# Patient Record
Sex: Male | Born: 1985 | Race: White | Hispanic: No | Marital: Married | Smoking: Never smoker
Health system: Southern US, Community
[De-identification: ages and names within clinical notes are randomized; demographics above are authoritative.]

## PROBLEM LIST (undated history)

## (undated) DIAGNOSIS — F909 Attention-deficit hyperactivity disorder, unspecified type: Secondary | ICD-10-CM

## (undated) DIAGNOSIS — F419 Anxiety disorder, unspecified: Secondary | ICD-10-CM

## (undated) HISTORY — PX: CHEST WALL RECONSTRUCTION: SHX1339

---

## 2008-08-13 ENCOUNTER — Encounter: Admission: RE | Admit: 2008-08-13 | Discharge: 2008-08-13 | Payer: Self-pay | Admitting: Ophthalmology

## 2012-07-27 ENCOUNTER — Ambulatory Visit (INDEPENDENT_AMBULATORY_CARE_PROVIDER_SITE_OTHER): Payer: 59 | Admitting: Family Medicine

## 2012-07-27 VITALS — BP 137/79 | HR 87 | Temp 98.3°F | Resp 16 | Ht 72.0 in | Wt 209.2 lb

## 2012-07-27 DIAGNOSIS — F411 Generalized anxiety disorder: Secondary | ICD-10-CM

## 2012-07-27 DIAGNOSIS — F988 Other specified behavioral and emotional disorders with onset usually occurring in childhood and adolescence: Secondary | ICD-10-CM

## 2012-07-27 NOTE — Patient Instructions (Addendum)
Psychologists and try to get in for ADD/ADHD testing. Tell them that you have spoken to me and I recommended you see them and get me a report as to what they're testing reveals. [facility]: Wyn[facility] Fanny, [phone], [phone] [facility] [phone], [phone]  I am not prescribing any medications at this time until I get results of testing.

## 2012-07-27 NOTE — Progress Notes (Signed)
Subjective: 27 year old male who feels like he has had problems with attention deficit problems for many years, dating back to school years. He completed high school, did about one year of college, and is taken some college level courses. 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 He has been married for 2 years, and has their first child on the way in the next couple of months. He is always active and gets a lot of exercise with what he does, though it is not do a formal regular physical exercise program. He has noticed many lapses where he just doesn't feel like he is focusing paying attention in doing so his tests in the proper fashion. He is not having trouble with this. He has not had problems in the emergent-type situations, only when he is going to his drills and retains. He has a hard time focusing when he reads and has to repeat reading the patient several times. Does not smoke or use drugs, occasional social drink. Physically he has been healthy. He had a surgery for pectus excavatum when he was a child.  Objective: Long discussion with patient. Physical exam not done.  Assessment: Possible attention deficit disorder.  Plan: Refer to psychologist for ADD testing. After something to replace yesterday, he had gone to human resources to see a psychologist who wanted him to see the doctor at that place, but he couldn't get a appointment for a long stretch. Therefore he came on in here today. He is wanting to get rolling on treatment as soon as possible, especially because of upcoming child in the family etc. His wife does have attention deficit disorder history, though she is off medications for now while she is pregnant.

## 2012-08-02 ENCOUNTER — Ambulatory Visit (INDEPENDENT_AMBULATORY_CARE_PROVIDER_SITE_OTHER): Payer: 59 | Admitting: Family Medicine

## 2012-08-02 ENCOUNTER — Encounter: Payer: Self-pay | Admitting: Family Medicine

## 2012-08-02 VITALS — BP 120/82 | HR 68 | Temp 97.8°F | Ht 72.5 in | Wt 208.0 lb

## 2012-08-02 DIAGNOSIS — F988 Other specified behavioral and emotional disorders with onset usually occurring in childhood and adolescence: Secondary | ICD-10-CM | POA: Insufficient documentation

## 2012-08-02 MED ORDER — AMPHETAMINE-DEXTROAMPHET ER 20 MG PO CP24: 20.0000 mg | ORAL_CAPSULE | ORAL | Status: DC

## 2012-08-02 NOTE — Patient Instructions (Addendum)
It was so nice to meet you.  Please call me in the next couple of weeks with an update.

## 2012-08-02 NOTE — Progress Notes (Signed)
Subjective:  Very pleasant 27 year old male here to establish care.  I see his wife, Morrie Sheldon.  He feels he has had problems with attention deficit problems for many years, dating back to school years. He completed high school, did about one year of college, and is taken some college level courses. 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 He has been married for 2 years, and has their first child on the way in the next couple of months.   He feels his attention has worsened recently.   He has noticed many lapses where he just doesn't feel like he is focusing paying attention in doing so his tests in the proper fashion.   He has never had issues focusing in emergency setting.  Went to UC earlier this month for this issue.  Was referred for psych eval.  Had appt with Hurley Cisco at [facility] on 08/01/2012.  Notes faxed here and reviewed.  She did feel he has ADD.  He is anxious to get started on medications now that his wife is expecting.  Patient Active Problem List   Diagnosis Date Noted  . ADD (attention deficit disorder) 08/02/2012   No past medical history on file. Past Surgical History  Procedure Laterality Date  . Chest wall reconstruction     History  Substance Use Topics  . Smoking status: Never Smoker   . Smokeless tobacco: Not on file  . Alcohol Use: Not on file   No family history on file. Allergies  Allergen Reactions  . Benadryl (Diphenhydramine Hcl) Other (See Comments)    Hyperactivity   No current outpatient prescriptions on file prior to visit.   No current facility-administered medications on file prior to visit.   The PMH, PSH, Social History, Family History, Medications, and allergies have been reviewed in [facility], and have been updated if relevant.  Objective: BP 120/82  Pulse 68  Temp(Src) 97.8 F (36.6 C)  Ht 6' 0.5" (1.842 m)  Wt 208 lb (94.348 kg)  BMI 27.81 kg/m2  Gen: Alert, pleasant, NAD Psych: Good  eye contact, not anxious or depressed appearing  Assessment and Plan: 1. ADD (attention deficit disorder) >25 min spent with face to face with patient, >50% counseling and/or coordinating care Start Adderall 25 mg XL daily. Follow up with me in 2-3 weeks.  Has appt with psych next week.  Encouraged him to continue those appt as well. The patient indicates understanding of these issues and agrees with the plan.     A

## 2012-08-04 ENCOUNTER — Telehealth: Payer: Self-pay | Admitting: *Deleted

## 2012-08-04 NOTE — Telephone Encounter (Signed)
Forms faxed to employer, patient advised.  Copy sent for scanning.

## 2012-08-04 NOTE — Telephone Encounter (Signed)
Forms completed and on my desk. 

## 2012-08-04 NOTE — Telephone Encounter (Signed)
Patient called to report that this is his 3rd day of taking adderall and today he's feeling a little jittery and has a bit of a headache, which he says you told him could happen.  He says he would like to give his body more time to adjust to the medicine, so, he's asking that the [facility] paperwork be completed and that he be able to stay out of work until 6/26.  Forms are on your desk.

## 2012-08-29 ENCOUNTER — Ambulatory Visit: Payer: Self-pay | Admitting: Family Medicine

## 2012-08-29 ENCOUNTER — Other Ambulatory Visit: Payer: Self-pay

## 2012-08-29 ENCOUNTER — Encounter: Payer: Self-pay | Admitting: Family Medicine

## 2012-08-29 MED ORDER — AMPHETAMINE-DEXTROAMPHET ER 20 MG PO CP24: 20.0000 mg | ORAL_CAPSULE | ORAL | Status: DC

## 2012-08-29 NOTE — Telephone Encounter (Signed)
Craig Fletcher left v/m requesting rx for generic adderall XR.(pharmacy has been giving pt brand name.) Call when ready for pick up.

## 2012-08-29 NOTE — Telephone Encounter (Signed)
Patient advised.  Rx left at front desk for pick up. 

## 2012-09-07 ENCOUNTER — Ambulatory Visit (INDEPENDENT_AMBULATORY_CARE_PROVIDER_SITE_OTHER): Payer: 59 | Admitting: Family Medicine

## 2012-09-07 ENCOUNTER — Encounter: Payer: Self-pay | Admitting: Family Medicine

## 2012-09-07 VITALS — BP 120/84 | HR 68 | Temp 97.6°F | Ht 72.5 in | Wt 201.0 lb

## 2012-09-07 DIAGNOSIS — R5383 Other fatigue: Secondary | ICD-10-CM | POA: Insufficient documentation

## 2012-09-07 DIAGNOSIS — R5381 Other malaise: Secondary | ICD-10-CM | POA: Insufficient documentation

## 2012-09-07 DIAGNOSIS — F988 Other specified behavioral and emotional disorders with onset usually occurring in childhood and adolescence: Secondary | ICD-10-CM

## 2012-09-07 LAB — CBC WITH DIFFERENTIAL/PLATELET
Basophils Absolute: 0 10*3/uL (ref 0.0–0.1)
Basophils Relative: 0.5 % (ref 0.0–3.0)
Eosinophils Absolute: 0 10*3/uL (ref 0.0–0.7)
Eosinophils Relative: 0.6 % (ref 0.0–5.0)
HCT: 42.3 % (ref 39.0–52.0)
Hemoglobin: 14.4 g/dL (ref 13.0–17.0)
Lymphocytes Relative: 17.9 % (ref 12.0–46.0)
Lymphs Abs: 1 10*3/uL (ref 0.7–4.0)
MCHC: 34 g/dL (ref 30.0–36.0)
MCV: 87.3 fl (ref 78.0–100.0)
Monocytes Absolute: 0.5 10*3/uL (ref 0.1–1.0)
Monocytes Relative: 8.7 % (ref 3.0–12.0)
Neutro Abs: 4.2 10*3/uL (ref 1.4–7.7)
Neutrophils Relative %: 72.3 % (ref 43.0–77.0)
Platelets: 246 10*3/uL (ref 150.0–400.0)
RBC: 4.84 Mil/uL (ref 4.22–5.81)
RDW: 13.7 % (ref 11.5–14.6)
WBC: 5.8 10*3/uL (ref 4.5–10.5)

## 2012-09-07 LAB — TSH: TSH: 2.21 u[IU]/mL (ref 0.35–5.50)

## 2012-09-07 LAB — T4, FREE: Free T4: 0.83 ng/dL (ref 0.60–1.60)

## 2012-09-07 LAB — TESTOSTERONE: Testosterone: 278.51 ng/dL — ABNORMAL LOW (ref 350.00–890.00)

## 2012-09-07 NOTE — Patient Instructions (Addendum)
Good to see you. I will call you with your lab results.   

## 2012-09-07 NOTE — Progress Notes (Signed)
Subjective:  Very pleasant 27 year old male here for follow up.    Has had formal evaluation for ADD (see Epic scanned notes).  Started Adderall 20 mg XL in 07/2012.  He feels is working well on most days.  Has noticed increased sweating but no insomnia or decreased appetite.  He has lost a few pounds but has had increased physical activity at work.  Wt Readings from Last 3 Encounters:  09/07/12 201 lb (91.173 kg)  08/02/12 208 lb (94.348 kg)  07/27/12 209 lb 3.2 oz (94.892 kg)   He has been fatigued for months. Wife is also concerned that he seems a little less interested in sexual activity.  No ED.  Denies feeling depressed.  Wife is 8 months pregnant and on bed rest so he has had more stressors at home.  Patient Active Problem List   Diagnosis Date Noted  . ADD (attention deficit disorder) 08/02/2012   No past medical history on file. Past Surgical History  Procedure Laterality Date  . Chest wall reconstruction     History  Substance Use Topics  . Smoking status: Never Smoker   . Smokeless tobacco: Not on file  . Alcohol Use: Not on file   No family history on file. Allergies  Allergen Reactions  . Benadryl (Diphenhydramine Hcl) Other (See Comments)    Hyperactivity   Current Outpatient Prescriptions on File Prior to Visit  Medication Sig Dispense Refill  . amphetamine-dextroamphetamine (ADDERALL XR) 20 MG 24 hr capsule Take 1 capsule (20 mg total) by mouth every morning.  30 capsule  0   No current facility-administered medications on file prior to visit.   The PMH, PSH, Social History, Family History, Medications, and allergies have been reviewed in [facility], and have been updated if relevant.  Objective: BP 120/84  Pulse 68  Temp(Src) 97.6 F (36.4 C)  Ht 6' 0.5" (1.842 m)  Wt 201 lb (91.173 kg)  BMI 26.87 kg/m2  Gen: Alert, pleasant, NAD Psych: Good eye contact, not anxious or depressed appearing  Assessment and Plan: 1. ADD (attention deficit  disorder) Improved with current dose of Adderall.  No changes today.  2. Other malaise and fatigue Deteriorated- likely due increased stressors at home and at work.  Will check labs today to rule out other possible contributing factors. The patient indicates understanding of these issues and agrees with the plan.  - CBC with Differential - TSH - T4, Free - Vitamin D, 25-hydroxy - Testosterone

## 2012-09-08 ENCOUNTER — Other Ambulatory Visit: Payer: Self-pay | Admitting: Family Medicine

## 2012-09-08 ENCOUNTER — Encounter: Payer: Self-pay | Admitting: Family Medicine

## 2012-09-08 DIAGNOSIS — R7989 Other specified abnormal findings of blood chemistry: Secondary | ICD-10-CM

## 2012-09-08 LAB — VITAMIN D 25 HYDROXY (VIT D DEFICIENCY, FRACTURES): Vit D, 25-Hydroxy: 40 ng/mL (ref 30–89)

## 2012-09-11 ENCOUNTER — Telehealth: Payer: Self-pay | Admitting: *Deleted

## 2012-09-11 NOTE — Telephone Encounter (Signed)
Pt's wife called, she states pt would like to have his PSA checked prior to his urology appt, just for peace of mind.  Please advise.

## 2012-09-11 NOTE — Telephone Encounter (Signed)
Advised pt's wife as instructed

## 2012-09-11 NOTE — Telephone Encounter (Signed)
There is no reason to check it now.  He is not having urinary symptoms and he is very young.  If they do start testosterone, urology will check it.

## 2012-09-14 ENCOUNTER — Telehealth: Payer: Self-pay

## 2012-09-14 NOTE — Telephone Encounter (Signed)
Advised patient.  He said he's going to finish out his first month on adderall and will look at maybe changing his dose then.

## 2012-09-14 NOTE — Telephone Encounter (Signed)
Thanks for the update.  He did not want to change adderall dosage when I saw him.  If he does want to change dose, please let me know.

## 2012-09-14 NOTE — Telephone Encounter (Signed)
Craig Fletcher left v/m; pt saw Dr Phoebe Perch and testosterone total & free test were WNL; Morrie Sheldon wants to know if pt needs to continue diet and exercise plan or does Dr Dayton Martes want to change Adderall medication; Morrie Sheldon request cb.

## 2012-09-25 ENCOUNTER — Telehealth: Payer: Self-pay | Admitting: *Deleted

## 2012-09-25 NOTE — Telephone Encounter (Signed)
Pt is asking if his adderall dose can be increased to XR, either 25 or 30 mg's.  He has about a week left on his current script.  He also knows you are out of the office and will see this note when you return.  He's ok to wait till then.  Please advise.

## 2012-09-27 MED ORDER — AMPHETAMINE-DEXTROAMPHET ER 25 MG PO CP24: 25.0000 mg | ORAL_CAPSULE | ORAL | Status: DC

## 2012-09-27 NOTE — Telephone Encounter (Signed)
Script is on your desk

## 2012-09-27 NOTE — Telephone Encounter (Signed)
Rx changed.  Please print out and put in my box for signature.

## 2012-09-28 NOTE — Telephone Encounter (Signed)
Advised pt that dosage has been increased and that new script is ready for pick up.

## 2012-10-23 ENCOUNTER — Ambulatory Visit (INDEPENDENT_AMBULATORY_CARE_PROVIDER_SITE_OTHER): Payer: 59

## 2012-10-23 DIAGNOSIS — Z23 Encounter for immunization: Secondary | ICD-10-CM

## 2012-12-01 ENCOUNTER — Telehealth: Payer: Self-pay | Admitting: Family Medicine

## 2012-12-01 MED ORDER — AMPHETAMINE-DEXTROAMPHET ER 10 MG PO CP24: 10.0000 mg | ORAL_CAPSULE | ORAL | Status: DC

## 2012-12-01 NOTE — Telephone Encounter (Signed)
Pt is on Adderall XR 25mg .  He states he does not need such a high dose anymore and would like to get a rx for Adderall XR 10mg  now.  He would like to pick this up today if at all possible.  Please call pt on cell [phone]

## 2012-12-01 NOTE — Telephone Encounter (Signed)
Rx printed

## 2012-12-01 NOTE — Telephone Encounter (Signed)
Pt came by to pick up rx. Signed rx at front desk for pick up.

## 2012-12-27 ENCOUNTER — Other Ambulatory Visit: Payer: Self-pay

## 2012-12-27 MED ORDER — AMPHETAMINE-DEXTROAMPHET ER 10 MG PO CP24
10.0000 mg | ORAL_CAPSULE | ORAL | Status: DC
Start: 2012-12-27 — End: 2013-03-07

## 2012-12-27 NOTE — Telephone Encounter (Signed)
pts wife request rx adderall xr. Call when ready for pick up.

## 2012-12-27 NOTE — Telephone Encounter (Signed)
RX done and in my out box

## 2012-12-27 NOTE — Telephone Encounter (Signed)
Pt's wife informed that RX ready at front desk.

## 2013-03-07 ENCOUNTER — Other Ambulatory Visit: Payer: Self-pay

## 2013-03-07 MED ORDER — AMPHETAMINE-DEXTROAMPHET ER 10 MG PO CP24: 10.0000 mg | ORAL_CAPSULE | ORAL | Status: DC

## 2013-03-07 NOTE — Telephone Encounter (Signed)
Refill printed and placed on Dr Dellie CatholicArons desk for signing on 03/08/13

## 2013-03-07 NOTE — Telephone Encounter (Signed)
Pt left v/m requesting rx adderall. Call when ready for pick up. 

## 2013-03-07 NOTE — Telephone Encounter (Signed)
Ok to print and put on my desk for signature. 

## 2013-03-08 NOTE — Telephone Encounter (Signed)
Lm on pts vm informing him Rx is at the front desk and available for pickup;informed a gov't issued photo id required for pickup

## 2013-04-09 ENCOUNTER — Other Ambulatory Visit: Payer: Self-pay | Admitting: Family Medicine

## 2013-04-09 MED ORDER — AMPHETAMINE-DEXTROAMPHET ER 10 MG PO CP24: 10.0000 mg | ORAL_CAPSULE | ORAL | Status: DC

## 2013-04-09 NOTE — Telephone Encounter (Signed)
Lm on pts vm informing him Rx is available for pickup at the front desk; informed a gov't issued photo id required for pickup. Wife not able to pickup for pt as substance contract needs to be renewed

## 2013-04-09 NOTE — Telephone Encounter (Signed)
Craig MilesAshley Below pts wife left v/m requesting rx adderall. Call when ready for pick up.

## 2013-04-11 ENCOUNTER — Encounter: Payer: Self-pay | Admitting: Family Medicine

## 2013-05-15 ENCOUNTER — Other Ambulatory Visit: Payer: Self-pay | Admitting: Family Medicine

## 2013-05-15 MED ORDER — AMPHETAMINE-DEXTROAMPHET ER 10 MG PO CP24
10.0000 mg | ORAL_CAPSULE | ORAL | Status: DC
Start: 2013-05-15 — End: 2013-06-11

## 2013-05-15 NOTE — Telephone Encounter (Signed)
Pt requesting medication refill. Last ov 08/2012 with no future appts scheduled. pls advise 

## 2013-05-15 NOTE — Telephone Encounter (Signed)
Pt is needing refill on Adderall

## 2013-05-15 NOTE — Telephone Encounter (Signed)
Spoke to pt and informed him Rx is available for pickup at the front desk; pt informed gov't issued photo id required for pickup. Pt also informed an OV is required for additional refills

## 2013-06-11 ENCOUNTER — Other Ambulatory Visit: Payer: Self-pay

## 2013-06-11 MED ORDER — AMPHETAMINE-DEXTROAMPHET ER 10 MG PO CP24: 10.0000 mg | ORAL_CAPSULE | ORAL | Status: DC

## 2013-06-11 NOTE — Telephone Encounter (Signed)
Lm on pts vm informing him Rx is available for pickup at the front desk 

## 2013-06-11 NOTE — Telephone Encounter (Signed)
Craig Fletcher pts wife left v/m requesting rx adderall. Call when ready for pick up.

## 2013-07-20 ENCOUNTER — Other Ambulatory Visit: Payer: Self-pay

## 2013-07-20 MED ORDER — AMPHETAMINE-DEXTROAMPHET ER 10 MG PO CP24: 10.0000 mg | ORAL_CAPSULE | ORAL | Status: DC

## 2013-07-20 NOTE — Telephone Encounter (Signed)
Ok to refill one time only.  Needs to be seen for further refills. 

## 2013-07-20 NOTE — Telephone Encounter (Signed)
Spoke to pt and informed him Rx is available for pickup. Pts last Rx indicated OV required. Pt given Rx on today, but if he does not f/u on 06/23 as scheduled, he will not receive ANY additional refills until seen

## 2013-07-20 NOTE — Telephone Encounter (Signed)
Pt left v/m requesting rx adderall. Call when ready for pick up. Last seen 09/07/12; no future appt scheduled.

## 2013-07-20 NOTE — Telephone Encounter (Signed)
Lm on pts vm requesting a call back. appt to be scheduled when pt calls or comes to pickup Rx

## 2013-08-07 ENCOUNTER — Ambulatory Visit (INDEPENDENT_AMBULATORY_CARE_PROVIDER_SITE_OTHER): Payer: 59 | Admitting: Family Medicine

## 2013-08-07 ENCOUNTER — Encounter: Payer: Self-pay | Admitting: Family Medicine

## 2013-08-07 VITALS — BP 124/74 | HR 70 | Temp 97.7°F | Ht 72.5 in | Wt 201.8 lb

## 2013-08-07 DIAGNOSIS — F988 Other specified behavioral and emotional disorders with onset usually occurring in childhood and adolescence: Secondary | ICD-10-CM

## 2013-08-07 NOTE — Progress Notes (Signed)
   Subjective:   Patient ID: Craig Fletcher, male    DOB: [birth year], 28 y.o.   MRN: [id number]  Craig MerinoJoseph F Wisecup is a pleasant 28 y.o. year old male who presents to clinic today with Follow-up  on 08/07/2013  HPI: Has had formal evaluation for ADD (see Epic scanned notes).   Pleased with current dose of Adderall 10 mg XR daily.  His boss and wife have noticed a difference in his productivity.  Denies insomnia, palpitations, CP or SOB.  Current Outpatient Prescriptions on File Prior to Visit  Medication Sig Dispense Refill  . amphetamine-dextroamphetamine (ADDERALL XR) 10 MG 24 hr capsule Take 1 capsule (10 mg total) by mouth every morning.  30 capsule  0   No current facility-administered medications on file prior to visit.    Allergies  Allergen Reactions  . Benadryl [Diphenhydramine Hcl] Other (See Comments)    Hyperactivity    No past medical history on file.  Past Surgical History  Procedure Laterality Date  . Chest wall reconstruction      No family history on file.  History   Social History  . Marital Status: Married    Spouse Name: N/A    Number of Children: N/A  . Years of Education: N/A   Occupational History  . Not on file.   Social History Main Topics  . Smoking status: Never Smoker   . Smokeless tobacco: Not on file  . Alcohol Use: Not on file  . Drug Use: Not on file  . Sexual Activity: Not on file   Other Topics Concern  . Not on file   Social History Narrative  . No narrative on file   The PMH, PSH, Social History, Family History, Medications, and allergies have been reviewed in [facility], and have been updated if relevant.    Review of Systems See HPI    Objective:    BP 124/74  Pulse 70  Temp(Src) 97.7 F (36.5 C) (Oral)  Ht 6' 0.5" (1.842 m)  Wt 201 lb 12 oz (91.513 kg)  BMI 26.97 kg/m2  SpO2 97%   Physical Exam  Gen:  Alert, pleasant, NAD Psych:  Good eye contact      Assessment & Plan:   ADD (attention deficit  disorder) No Follow-up on file.

## 2013-08-07 NOTE — Progress Notes (Signed)
Pre visit review using our clinic review tool, if applicable. No additional management support is needed unless otherwise documented below in the visit note. 

## 2013-08-07 NOTE — Assessment & Plan Note (Signed)
>  15 minutes spent in face to face time with patient, >50% spent in counselling or coordination of care Stable on current rx. No changes.

## 2013-08-31 ENCOUNTER — Other Ambulatory Visit: Payer: Self-pay

## 2013-08-31 MED ORDER — AMPHETAMINE-DEXTROAMPHET ER 10 MG PO CP24: 10.0000 mg | ORAL_CAPSULE | ORAL | Status: DC

## 2013-08-31 NOTE — Telephone Encounter (Signed)
Pt left v/m requesting rx for Adderall. Call when ready for pick up.  

## 2013-08-31 NOTE — Telephone Encounter (Signed)
Lm on pts vm informing him Rx is available for pickup at the front desk 

## 2013-10-08 ENCOUNTER — Other Ambulatory Visit: Payer: Self-pay

## 2013-10-08 MED ORDER — AMPHETAMINE-DEXTROAMPHET ER 10 MG PO CP24: 10.0000 mg | ORAL_CAPSULE | ORAL | Status: DC

## 2013-10-08 NOTE — Telephone Encounter (Signed)
Pt left v/m requesting rx for Adderall. Call when ready for pick up.  

## 2013-10-08 NOTE — Telephone Encounter (Signed)
Lm on pts vm informing him Rx is available for pickup; pt advised he is not able to send someone to pick up for him

## 2013-10-09 ENCOUNTER — Encounter: Payer: Self-pay | Admitting: Family Medicine

## 2013-11-14 ENCOUNTER — Other Ambulatory Visit: Payer: Self-pay

## 2013-11-14 MED ORDER — AMPHETAMINE-DEXTROAMPHET ER 10 MG PO CP24: 10.0000 mg | ORAL_CAPSULE | ORAL | Status: DC

## 2013-11-14 NOTE — Telephone Encounter (Signed)
Pt left v/m requesting rx for Adderall. Call when ready for pick up.  

## 2013-11-14 NOTE — Telephone Encounter (Signed)
Lm on pts vm informing him Rx is available for pickup at the front desk 

## 2013-11-26 ENCOUNTER — Ambulatory Visit (INDEPENDENT_AMBULATORY_CARE_PROVIDER_SITE_OTHER): Payer: 59

## 2013-11-26 DIAGNOSIS — Z23 Encounter for immunization: Secondary | ICD-10-CM

## 2013-12-17 ENCOUNTER — Other Ambulatory Visit: Payer: Self-pay

## 2013-12-17 MED ORDER — AMPHETAMINE-DEXTROAMPHET ER 10 MG PO CP24: 10.0000 mg | ORAL_CAPSULE | ORAL | Status: DC

## 2013-12-17 NOTE — Telephone Encounter (Signed)
Pt left v/m requesting rx for Adderall XR. Call when ready for pick up.  

## 2013-12-17 NOTE — Telephone Encounter (Signed)
Lm on pts vm informing him Rx is available for pickup from the front desk 

## 2014-01-21 ENCOUNTER — Other Ambulatory Visit: Payer: Self-pay

## 2014-01-21 MED ORDER — AMPHETAMINE-DEXTROAMPHET ER 10 MG PO CP24: 10.0000 mg | ORAL_CAPSULE | ORAL | Status: DC

## 2014-01-21 NOTE — Telephone Encounter (Signed)
Pt left v/m requesting rx for Adderall. Call when ready for pick up.  

## 2014-01-22 MED ORDER — AMPHETAMINE-DEXTROAMPHET ER 10 MG PO CP24: 10.0000 mg | ORAL_CAPSULE | ORAL | Status: DC

## 2014-01-22 NOTE — Telephone Encounter (Signed)
Rx reprinted; Lm on pts vm informing him Rx will be available for pickup after 1400

## 2014-01-22 NOTE — Addendum Note (Signed)
Addended by: Desmond DikeKNIGHT, Appolonia Ackert H on: 01/22/2014 10:35 AM   Modules accepted: Orders

## 2014-03-04 ENCOUNTER — Other Ambulatory Visit: Payer: Self-pay

## 2014-03-04 MED ORDER — AMPHETAMINE-DEXTROAMPHET ER 10 MG PO CP24: 10.0000 mg | ORAL_CAPSULE | ORAL | Status: DC

## 2014-03-04 NOTE — Telephone Encounter (Signed)
Pt left v/m requesting rx for Adderall. Call when ready for pick up.  

## 2014-03-05 NOTE — Telephone Encounter (Signed)
Lm on pts vm informing him Rx is available for pickup from the front desk 

## 2014-04-09 ENCOUNTER — Encounter: Payer: Self-pay | Admitting: Family Medicine

## 2014-04-11 ENCOUNTER — Other Ambulatory Visit: Payer: Self-pay

## 2014-04-11 MED ORDER — AMPHETAMINE-DEXTROAMPHET ER 10 MG PO CP24: 10.0000 mg | ORAL_CAPSULE | ORAL | Status: DC

## 2014-04-11 NOTE — Telephone Encounter (Signed)
Pt left v/m requesting rx for Adderall. Call when ready for pick up. Last seen 08/07/13.

## 2014-04-12 MED ORDER — AMPHETAMINE-DEXTROAMPHET ER 10 MG PO CP24
10.0000 mg | ORAL_CAPSULE | ORAL | Status: DC
Start: 2014-04-12 — End: 2014-05-22

## 2014-04-12 NOTE — Addendum Note (Signed)
Addended by: Desmond DikeKNIGHT, Odin Mariani H on: 04/12/2014 11:41 AM   Modules accepted: Orders

## 2014-04-12 NOTE — Telephone Encounter (Signed)
Dr Aron approved these, but they were not printed and signed. Do you mind signing off on these for me, please? 

## 2014-04-12 NOTE — Telephone Encounter (Signed)
yes

## 2014-04-12 NOTE — Telephone Encounter (Signed)
Lm on pts vm and informed him Rx is available for pickup from the front desk; pt advised third party unable to pickup 

## 2014-05-22 ENCOUNTER — Other Ambulatory Visit: Payer: Self-pay

## 2014-05-22 MED ORDER — AMPHETAMINE-DEXTROAMPHET ER 10 MG PO CP24
10.0000 mg | ORAL_CAPSULE | ORAL | Status: DC
Start: 2014-05-22 — End: 2014-06-25

## 2014-05-22 NOTE — Telephone Encounter (Signed)
Lm on pts vm informing him Rx is available for pickup from the front desk 

## 2014-05-22 NOTE — Telephone Encounter (Signed)
Pt left v/m requesting rx for Adderall. Call when ready for pick up. Pt last seen 08/07/13.

## 2014-06-25 ENCOUNTER — Other Ambulatory Visit: Payer: Self-pay

## 2014-06-25 ENCOUNTER — Encounter: Payer: Self-pay | Admitting: Family Medicine

## 2014-06-25 MED ORDER — AMPHETAMINE-DEXTROAMPHET ER 10 MG PO CP24: 10.0000 mg | ORAL_CAPSULE | ORAL | Status: DC

## 2014-06-25 NOTE — Telephone Encounter (Signed)
Pt's wife left v/m requesting rx for Adderall. Call when ready for pick up. Pt last seen 08/07/13 and rx last printed 05/22/2014.

## 2014-06-25 NOTE — Telephone Encounter (Signed)
Lm on pts vm informing him Rx is available for pickup from the front desk. Advised third party unable to pickup

## 2014-07-23 ENCOUNTER — Encounter: Payer: Self-pay | Admitting: Family Medicine

## 2014-08-02 ENCOUNTER — Encounter: Payer: Self-pay | Admitting: Family Medicine

## 2014-08-13 ENCOUNTER — Other Ambulatory Visit: Payer: Self-pay

## 2014-08-13 MED ORDER — AMPHETAMINE-DEXTROAMPHET ER 10 MG PO CP24: 10.0000 mg | ORAL_CAPSULE | ORAL | Status: DC

## 2014-08-13 NOTE — Telephone Encounter (Signed)
Pt left v/m requesting rx for Adderall. Call when ready for pick up. Last seen 08/07/2013 and no future appt scheduled. rx last printed # 30 on 06/25/2014.

## 2014-08-13 NOTE — Telephone Encounter (Signed)
Attempted to contact pt. Unable to leave message, Rx available for pickup at the front desk

## 2014-09-16 ENCOUNTER — Other Ambulatory Visit: Payer: Self-pay | Admitting: Family Medicine

## 2014-09-16 MED ORDER — AMPHETAMINE-DEXTROAMPHET ER 10 MG PO CP24: 10.0000 mg | ORAL_CAPSULE | ORAL | Status: DC

## 2014-09-30 ENCOUNTER — Ambulatory Visit (INDEPENDENT_AMBULATORY_CARE_PROVIDER_SITE_OTHER): Payer: 59 | Admitting: Family Medicine

## 2014-09-30 ENCOUNTER — Encounter: Payer: Self-pay | Admitting: Family Medicine

## 2014-09-30 VITALS — BP 114/78 | HR 70 | Temp 98.0°F | Wt 204.5 lb

## 2014-09-30 DIAGNOSIS — R5383 Other fatigue: Secondary | ICD-10-CM

## 2014-09-30 DIAGNOSIS — F909 Attention-deficit hyperactivity disorder, unspecified type: Secondary | ICD-10-CM

## 2014-09-30 DIAGNOSIS — F988 Other specified behavioral and emotional disorders with onset usually occurring in childhood and adolescence: Secondary | ICD-10-CM

## 2014-09-30 MED ORDER — AMPHETAMINE-DEXTROAMPHET ER 20 MG PO CP24: 20.0000 mg | ORAL_CAPSULE | Freq: Every day | ORAL | Status: DC

## 2014-09-30 NOTE — Progress Notes (Signed)
Pre visit review using our clinic review tool, if applicable. No additional management support is needed unless otherwise documented below in the visit note. 

## 2014-09-30 NOTE — Progress Notes (Signed)
   Subjective:   Patient ID: Craig Fletcher, male    DOB: 1985-10-13, 29 y.o.   MRN: [id number]  Craig Fletcher is a pleasant 29 y.o. year old male who presents to clinic today with Follow-up  on 09/30/2014  HPI: Add- tested positive with formal evaluation for ADD (see Epic scanned notes).   Pleased with current dose of Adderall 10 mg XR daily.  Denies insomnia, palpitations, CP or SOB.  Feels current dose is working well at work but not after when he tries to start tasks at home.  Has also been more tired lately.  Wants labs including testosterone checked.  Denies any difficulty achieving or maintaining an erection.  No decreased sex drive.  Not sleeping that well- toddler has been sleeping in their bed lately. Current Outpatient Prescriptions on File Prior to Visit  Medication Sig Dispense Refill  . amphetamine-dextroamphetamine (ADDERALL XR) 10 MG 24 hr capsule Take 1 capsule (10 mg total) by mouth every morning. 30 capsule 0   No current facility-administered medications on file prior to visit.    Allergies  Allergen Reactions  . Benadryl [Diphenhydramine Hcl] Other (See Comments)    Hyperactivity    History reviewed. No pertinent past medical history.  Past Surgical History  Procedure Laterality Date  . Chest wall reconstruction      History reviewed. No pertinent family history.  Social History   Social History  . Marital Status: Married    Spouse Name: N/A  . Number of Children: N/A  . Years of Education: N/A   Occupational History  . Not on file.   Social History Main Topics  . Smoking status: Never Smoker   . Smokeless tobacco: Not on file  . Alcohol Use: Not on file  . Drug Use: Not on file  . Sexual Activity: Not on file   Other Topics Concern  . Not on file   Social History Narrative   The PMH, PSH, Social History, Family History, Medications, and allergies have been reviewed in [facility], and have been updated if relevant.    Review of Systems   Constitutional: Positive for fatigue.  Genitourinary: Negative.   Neurological: Negative.   Psychiatric/Behavioral: Positive for decreased concentration. Negative for suicidal ideas, hallucinations, behavioral problems, sleep disturbance, self-injury, dysphoric mood and agitation. The patient is not nervous/anxious and is not hyperactive.   All other systems reviewed and are negative.      Objective:    BP 114/78 mmHg  Pulse 70  Temp(Src) 98 F (36.7 C) (Oral)  Wt 204 lb 8 oz (92.761 kg)  SpO2 97%   Physical Exam  Constitutional: He is oriented to person, place, and time. He appears well-developed and well-nourished. No distress.  HENT:  Head: Normocephalic.  Eyes: Conjunctivae are normal.  Cardiovascular: Normal rate.   Pulmonary/Chest: Effort normal.  Neurological: He is alert and oriented to person, place, and time. No cranial nerve deficit.  Skin: Skin is warm and dry.  Psychiatric: He has a normal mood and affect. His behavior is normal. Judgment and thought content normal.  Nursing note and vitals reviewed.         Assessment & Plan:   ADD (attention deficit disorder) No Follow-up on file.

## 2014-09-30 NOTE — Assessment & Plan Note (Signed)
Deteriorated. >25 minutes spent in face to face time with patient, >50% spent in counselling or coordination of care Increase dose of Adderall to 20 mg XL daily. He will call me in a few weeks with an update.

## 2014-09-30 NOTE — Assessment & Plan Note (Signed)
New- likely multifactorial- toddler not sleeping well. Ok to check labs. Orders Placed This Encounter  Procedures  . Testosterone  . CBC with Differential/Platelet  . Comprehensive metabolic panel

## 2014-09-30 NOTE — Patient Instructions (Signed)
Great to see you. We are increasing your Adderall XR 20 mg daily.  Please keep me updated.  Please return for you labs in the morning.

## 2014-10-01 ENCOUNTER — Other Ambulatory Visit (INDEPENDENT_AMBULATORY_CARE_PROVIDER_SITE_OTHER): Payer: 59

## 2014-10-01 ENCOUNTER — Encounter: Payer: Self-pay | Admitting: *Deleted

## 2014-10-01 DIAGNOSIS — R5383 Other fatigue: Secondary | ICD-10-CM

## 2014-10-01 LAB — COMPREHENSIVE METABOLIC PANEL
ALT: 22 U/L (ref 0–53)
AST: 35 U/L (ref 0–37)
Albumin: 4.7 g/dL (ref 3.5–5.2)
Alkaline Phosphatase: 39 U/L (ref 39–117)
BUN: 18 mg/dL (ref 6–23)
CO2: 32 mEq/L (ref 19–32)
Calcium: 9.8 mg/dL (ref 8.4–10.5)
Chloride: 100 mEq/L (ref 96–112)
Creatinine, Ser: 0.89 mg/dL (ref 0.40–1.50)
GFR: 107.29 mL/min (ref >60.00)
Glucose, Bld: 99 mg/dL (ref 70–99)
Potassium: 4.1 mEq/L (ref 3.5–5.1)
Sodium: 138 mEq/L (ref 135–145)
Total Bilirubin: 0.7 mg/dL (ref 0.2–1.2)
Total Protein: 7.6 g/dL (ref 6.0–8.3)

## 2014-10-01 LAB — TESTOSTERONE: Testosterone: 343.35 ng/dL (ref 300.00–890.00)

## 2014-10-01 LAB — CBC WITH DIFFERENTIAL/PLATELET
Basophils Absolute: 0 10*3/uL (ref 0.0–0.1)
Basophils Relative: 0.7 % (ref 0.0–3.0)
Eosinophils Absolute: 0.1 10*3/uL (ref 0.0–0.7)
Eosinophils Relative: 1.1 % (ref 0.0–5.0)
HCT: 45.8 % (ref 39.0–52.0)
Hemoglobin: 15.3 g/dL (ref 13.0–17.0)
Lymphocytes Relative: 30.6 % (ref 12.0–46.0)
Lymphs Abs: 1.5 10*3/uL (ref 0.7–4.0)
MCHC: 33.5 g/dL (ref 30.0–36.0)
MCV: 86.2 fl (ref 78.0–100.0)
Monocytes Absolute: 0.4 10*3/uL (ref 0.1–1.0)
Monocytes Relative: 9.2 % (ref 3.0–12.0)
Neutro Abs: 2.8 10*3/uL (ref 1.4–7.7)
Neutrophils Relative %: 58.4 % (ref 43.0–77.0)
Platelets: 274 10*3/uL (ref 150.0–400.0)
RBC: 5.31 Mil/uL (ref 4.22–5.81)
RDW: 13.9 % (ref 11.5–15.5)
WBC: 4.8 10*3/uL (ref 4.0–10.5)

## 2014-10-15 ENCOUNTER — Other Ambulatory Visit (INDEPENDENT_AMBULATORY_CARE_PROVIDER_SITE_OTHER): Payer: 59

## 2014-10-15 DIAGNOSIS — R7989 Other specified abnormal findings of blood chemistry: Secondary | ICD-10-CM

## 2014-10-15 DIAGNOSIS — E291 Testicular hypofunction: Secondary | ICD-10-CM

## 2014-10-15 LAB — TESTOSTERONE: Testosterone: 404.75 ng/dL (ref 300.00–890.00)

## 2014-10-15 NOTE — Addendum Note (Signed)
Addended by: Baldomero Lamy on: 10/15/2014 08:55 AM   Modules accepted: Orders

## 2014-10-23 ENCOUNTER — Other Ambulatory Visit: Payer: Self-pay | Admitting: Family Medicine

## 2014-10-23 ENCOUNTER — Telehealth: Payer: Self-pay | Admitting: Family Medicine

## 2014-10-23 DIAGNOSIS — R7989 Other specified abnormal findings of blood chemistry: Secondary | ICD-10-CM

## 2014-10-23 NOTE — Telephone Encounter (Signed)
Please apologize for the delay.  Testosterone a little better but still on low side.  I would still like to refer him to urology.  Referral placed.

## 2014-10-23 NOTE — Telephone Encounter (Signed)
Pt requests callback regarding labs, Please call [phone]

## 2014-10-23 NOTE — Telephone Encounter (Signed)
Spoke to pt and informed him of results and instruction. Pt advised to await a call with referral appt details

## 2014-11-12 ENCOUNTER — Other Ambulatory Visit: Payer: Self-pay | Admitting: *Deleted

## 2014-11-12 MED ORDER — AMPHETAMINE-DEXTROAMPHET ER 20 MG PO CP24: 20.0000 mg | ORAL_CAPSULE | Freq: Every day | ORAL | Status: DC

## 2014-11-12 NOTE — Telephone Encounter (Signed)
Spoke to pt and informed him Rx is available for pickup from the front desk 

## 2014-11-12 NOTE — Telephone Encounter (Signed)
OV to eval on 09/30/14.  #30 given at that time with no refills.

## 2014-12-17 ENCOUNTER — Encounter: Payer: Self-pay | Admitting: Family Medicine

## 2014-12-17 ENCOUNTER — Other Ambulatory Visit: Payer: Self-pay

## 2014-12-17 MED ORDER — AMPHETAMINE-DEXTROAMPHET ER 20 MG PO CP24: 20.0000 mg | ORAL_CAPSULE | Freq: Every day | ORAL | Status: DC

## 2014-12-17 NOTE — Telephone Encounter (Signed)
Lm on pts vm informing pt Rx available for pickup from the front desk. Advised pt third party unable to pickup

## 2014-12-17 NOTE — Telephone Encounter (Signed)
Pt left v/m requesting rx for Adderall. Call when ready for pick up. rx last printed # 30 on 11/12/14. Last seen 09/30/14.

## 2015-01-02 ENCOUNTER — Encounter: Payer: Self-pay | Admitting: Family Medicine

## 2015-01-23 ENCOUNTER — Other Ambulatory Visit: Payer: Self-pay

## 2015-01-23 MED ORDER — AMPHETAMINE-DEXTROAMPHET ER 20 MG PO CP24: 20.0000 mg | ORAL_CAPSULE | Freq: Every day | ORAL | Status: DC

## 2015-01-23 NOTE — Telephone Encounter (Signed)
Pt left v/m requesting rx for Adderall. Call when ready for pick up. rx last printed # 30 on 12/17/14. Last seen 09/30/14.

## 2015-01-23 NOTE — Telephone Encounter (Signed)
Spoke to pt and informed him Rx is available for pickup from the front desk 

## 2015-02-28 ENCOUNTER — Other Ambulatory Visit: Payer: Self-pay

## 2015-02-28 MED ORDER — AMPHETAMINE-DEXTROAMPHET ER 20 MG PO CP24: 20.0000 mg | ORAL_CAPSULE | Freq: Every day | ORAL | Status: DC

## 2015-02-28 NOTE — Telephone Encounter (Signed)
Spoke with patient and advised rx ready for pick-up and it will be at the front desk.  

## 2015-02-28 NOTE — Telephone Encounter (Signed)
Pt left v/m requesting rx for Adderall. Call when ready for pick up. rx last printed #30 on 01/23/15. Pt last seen 09/30/14. Dr Dayton MartesAron out of office.

## 2015-04-01 ENCOUNTER — Other Ambulatory Visit: Payer: Self-pay | Admitting: *Deleted

## 2015-04-01 MED ORDER — AMPHETAMINE-DEXTROAMPHET ER 20 MG PO CP24: 20.0000 mg | ORAL_CAPSULE | Freq: Every day | ORAL | Status: DC

## 2015-04-01 NOTE — Telephone Encounter (Signed)
Last filled #30 on 02/28/15.  Last follow up 09/30/14.  Okay to refill?

## 2015-04-01 NOTE — Telephone Encounter (Signed)
Left message on voicemail [facility]) that script is up front ready for pickup/

## 2015-04-16 ENCOUNTER — Encounter: Payer: Self-pay | Admitting: Family Medicine

## 2015-04-16 ENCOUNTER — Ambulatory Visit (INDEPENDENT_AMBULATORY_CARE_PROVIDER_SITE_OTHER): Payer: 59 | Admitting: Family Medicine

## 2015-04-16 VITALS — BP 114/68 | HR 76 | Temp 98.1°F | Wt 207.8 lb

## 2015-04-16 DIAGNOSIS — I889 Nonspecific lymphadenitis, unspecified: Secondary | ICD-10-CM

## 2015-04-16 MED ORDER — AMOXICILLIN-POT CLAVULANATE 875-125 MG PO TABS
1.0000 | ORAL_TABLET | Freq: Two times a day (BID) | ORAL | Status: AC
Start: 2015-04-16 — End: 2015-04-30

## 2015-04-16 NOTE — Progress Notes (Signed)
Subjective:   Patient ID: Craig Fletcher, male    DOB: Oct 20, 1985, 30 y.o.   MRN: [id number]  Craig Fletcher is a pleasant 30 y.o. year old male year old male who presents to clinic today with Mass  on 04/16/2015  HPI:  Lump on left side of neck- noticed it a week or so ago after having some nasal congestion which has since resolved. No difficulty swallowing or pain with chewing.  It is non tender.  No warmth or erythema.  No fever. UTD dental exams. No tooth pain.  No h/o trauma or animal bites/scratches. Current Outpatient Prescriptions on File Prior to Visit  Medication Sig Dispense Refill  . amphetamine-dextroamphetamine (ADDERALL XR) 20 MG 24 hr capsule Take 1 capsule (20 mg total) by mouth daily. 30 capsule 0   No current facility-administered medications on file prior to visit.    Allergies  Allergen Reactions  . Benadryl [Diphenhydramine Hcl] Other (See Comments)    Hyperactivity    No past medical history on file.  Past Surgical History  Procedure Laterality Date  . Chest wall reconstruction      No family history on file.  Social History   Social History  . Marital Status: Married    Spouse Name: N/A  . Number of Children: N/A  . Years of Education: N/A   Occupational History  . Not on file.   Social History Main Topics  . Smoking status: Never Smoker   . Smokeless tobacco: Not on file  . Alcohol Use: Not on file  . Drug Use: Not on file  . Sexual Activity: Not on file   Other Topics Concern  . Not on file   Social History Narrative   The PMH, PSH, Social History, Family History, Medications, and allergies have been reviewed in [facility], and have been updated if relevant.   Review of Systems  HENT: Positive for postnasal drip. Negative for sinus pressure, sneezing, sore throat, tinnitus, trouble swallowing and voice change.   Respiratory: Negative.   Cardiovascular: Negative.   Gastrointestinal: Negative.   Musculoskeletal: Negative.   Skin: Negative.     Neurological: Negative.   Hematological: Positive for adenopathy.  Psychiatric/Behavioral: Negative.   All other systems reviewed and are negative.      Objective:    BP 114/68 mmHg  Pulse 76  Temp(Src) 98.1 F (36.7 C) (Oral)  Wt 207 lb 12 oz (94.235 kg)  SpO2 96%   Physical Exam  Constitutional: He is oriented to person, place, and time. He appears well-developed and well-nourished. No distress.  HENT:  Head: Normocephalic.  Eyes: Conjunctivae are normal.  Cardiovascular: Normal rate.   Musculoskeletal: Normal range of motion.  Lymphadenopathy:       Head (right side): No submental, no submandibular, no tonsillar, no preauricular, no posterior auricular and no occipital adenopathy present.       Head (left side): No submental, no submandibular, no tonsillar, no preauricular, no posterior auricular and no occipital adenopathy present.    He has cervical adenopathy.       Right cervical: No deep cervical and no posterior cervical adenopathy present.      Left cervical: Superficial cervical adenopathy present. No deep cervical and no posterior cervical adenopathy present.    He has no axillary adenopathy.  Neurological: He is alert and oriented to person, place, and time. No cranial nerve deficit.  Skin: Skin is warm and dry. He is not diaphoretic.  Psychiatric: He has a normal mood and affect. His  behavior is normal. Judgment and thought content normal.  Nursing note and vitals reviewed.         Assessment & Plan:   Lymphadenitis No Follow-up on file.

## 2015-04-16 NOTE — Progress Notes (Signed)
Pre visit review using our clinic review tool, if applicable. No additional management support is needed unless otherwise documented below in the visit note. 

## 2015-04-16 NOTE — Patient Instructions (Signed)

## 2015-04-16 NOTE — Assessment & Plan Note (Signed)
New- no red flag symptoms. Augmentin twice daily x 10 days. Call or return to clinic prn if these symptoms worsen or fail to improve as anticipated. The patient indicates understanding of these issues and agrees with the plan.

## 2015-05-02 ENCOUNTER — Other Ambulatory Visit: Payer: Self-pay

## 2015-05-02 MED ORDER — AMPHETAMINE-DEXTROAMPHET ER 20 MG PO CP24: 20.0000 mg | ORAL_CAPSULE | Freq: Every day | ORAL | Status: DC

## 2015-05-02 NOTE — Telephone Encounter (Signed)
Ok to print and put in my box for signature. 

## 2015-05-02 NOTE — Telephone Encounter (Signed)
Pt left v/m requesting rx for Adderall. Call when ready for pick up. Last printed # 30 on 04/01/15; last f/u appt 09/30/14.

## 2015-05-05 NOTE — Telephone Encounter (Signed)
Lm on pts vm and informed him Rx is available for pickup from the front desk 

## 2015-06-04 ENCOUNTER — Encounter: Payer: Self-pay | Admitting: Family Medicine

## 2015-06-04 ENCOUNTER — Other Ambulatory Visit: Payer: Self-pay | Admitting: Family Medicine

## 2015-06-04 DIAGNOSIS — F4323 Adjustment disorder with mixed anxiety and depressed mood: Secondary | ICD-10-CM

## 2015-06-09 ENCOUNTER — Ambulatory Visit (INDEPENDENT_AMBULATORY_CARE_PROVIDER_SITE_OTHER): Payer: 59 | Admitting: Family Medicine

## 2015-06-09 ENCOUNTER — Encounter: Payer: Self-pay | Admitting: Family Medicine

## 2015-06-09 VITALS — BP 102/62 | HR 68 | Temp 98.5°F | Ht 72.0 in | Wt 207.8 lb

## 2015-06-09 DIAGNOSIS — F988 Other specified behavioral and emotional disorders with onset usually occurring in childhood and adolescence: Secondary | ICD-10-CM

## 2015-06-09 DIAGNOSIS — F4323 Adjustment disorder with mixed anxiety and depressed mood: Secondary | ICD-10-CM | POA: Diagnosis not present

## 2015-06-09 DIAGNOSIS — F909 Attention-deficit hyperactivity disorder, unspecified type: Secondary | ICD-10-CM

## 2015-06-09 MED ORDER — SERTRALINE HCL 25 MG PO TABS: 25.0000 mg | ORAL_TABLET | Freq: Every day | ORAL | Status: DC

## 2015-06-09 MED ORDER — AMPHETAMINE-DEXTROAMPHET ER 20 MG PO CP24: 20.0000 mg | ORAL_CAPSULE | Freq: Every day | ORAL | Status: DC

## 2015-06-09 NOTE — Assessment & Plan Note (Signed)
>  25 minutes spent in face to face time with patient, >50% spent in counselling or coordination of care Referred to psychotherapy. Continue current dose of Adderall- start zoloft 25 mg daily. Discussed potential side effects. Follow up by phone or email in 3 weeks. The patient indicates understanding of these issues and agrees with the plan.

## 2015-06-09 NOTE — Progress Notes (Signed)
Pre visit review using our clinic review tool, if applicable. No additional management support is needed unless otherwise documented below in the visit note. 

## 2015-06-09 NOTE — Patient Instructions (Signed)
Great to see you. We are starting zoloft 25 mg daily. Please update me in a few weeks.

## 2015-06-09 NOTE — Progress Notes (Signed)
   Subjective:   Patient ID: Craig Fletcher, male    DOB: October 07, 1985, 30 y.o.   MRN: [id number]  Craig MerinoJoseph F Balderson is a pleasant 30 y.o. year old male who presents to clinic today with Medication Management  on 06/09/2015  HPI: ? Anxiety- past several weeks, feels more anxious at home.  Denies feeling depressed.  Feels panicky when he thinks about all he has to do.  Feels adderall dosage is good because he is completing tasks efficiently at work.   They are building a new house.  He and his wife both working and raising their son.  Not sleeping as well.  He does feel like his marriage is happy.  Denies feeling sad.  No SI or HI.  Appetite good. Wt Readings from Last 3 Encounters:  06/09/15 207 lb 12.8 oz (94.257 kg)  04/16/15 207 lb 12 oz (94.235 kg)  09/30/14 204 lb 8 oz (92.761 kg)   No current outpatient prescriptions on file prior to visit.   No current facility-administered medications on file prior to visit.    Allergies  Allergen Reactions  . Benadryl [Diphenhydramine Hcl] Other (See Comments)    Hyperactivity    No past medical history on file.  Past Surgical History  Procedure Laterality Date  . Chest wall reconstruction      No family history on file.  Social History   Social History  . Marital Status: Married    Spouse Name: N/A  . Number of Children: N/A  . Years of Education: N/A   Occupational History  . Not on file.   Social History Main Topics  . Smoking status: Never Smoker   . Smokeless tobacco: Not on file  . Alcohol Use: Not on file  . Drug Use: Not on file  . Sexual Activity: Not on file   Other Topics Concern  . Not on file   Social History Narrative   The PMH, PSH, Social History, Family History, Medications, and allergies [facility], and have been updated if relevant.   Review of Systems  Psychiatric/Behavioral: Positive for sleep disturbance. Negative for suicidal ideas, hallucinations, behavioral problems,  confusion, self-injury, dysphoric mood, decreased concentration and agitation. The patient is nervous/anxious. The patient is not hyperactive.   All other systems reviewed and are negative.      Objective:    BP 102/62 mmHg  Pulse 68  Temp(Src) 98.5 F (36.9 C) (Oral)  Ht 6' (1.829 m)  Wt 207 lb 12.8 oz (94.257 kg)  BMI 28.18 kg/m2  SpO2 98%   Physical Exam  Constitutional: He is oriented to person, place, and time. He appears well-developed and well-nourished. No distress.  HENT:  Head: Normocephalic.  Eyes: Conjunctivae are normal.  Cardiovascular: Normal rate.   Pulmonary/Chest: Effort normal.  Musculoskeletal: Normal range of motion.  Neurological: He is alert and oriented to person, place, and time. No cranial nerve deficit.  Skin: Skin is warm and dry. He is not diaphoretic.  Psychiatric: He has a normal mood and affect. His behavior is normal. Judgment and thought content normal.  Nursing note and vitals reviewed.         Assessment & Plan:   ADD (attention deficit disorder)  Adjustment disorder with mixed anxiety and depressed mood No Follow-up on file.

## 2015-06-17 ENCOUNTER — Encounter: Payer: Self-pay | Admitting: Family Medicine

## 2015-06-25 ENCOUNTER — Ambulatory Visit (INDEPENDENT_AMBULATORY_CARE_PROVIDER_SITE_OTHER): Payer: 59 | Admitting: Psychology

## 2015-06-25 DIAGNOSIS — F4321 Adjustment disorder with depressed mood: Secondary | ICD-10-CM

## 2015-07-11 ENCOUNTER — Other Ambulatory Visit: Payer: Self-pay

## 2015-07-11 MED ORDER — AMPHETAMINE-DEXTROAMPHET ER 20 MG PO CP24: 20.0000 mg | ORAL_CAPSULE | Freq: Every day | ORAL | Status: DC

## 2015-07-11 NOTE — Telephone Encounter (Signed)
Pt's wife left v/m requesting rx for Adderall. Call when ready for pick up. Pt last seen and rx last printed # 30 on 06/09/15. Requesting to pick up rx on 07/11/15. Dr Dayton MartesAron out of office.

## 2015-07-11 NOTE — Telephone Encounter (Signed)
Lm on pts vm and advised Rx is available for pickup at the front desk. Advised third party unable to pickup

## 2015-07-15 ENCOUNTER — Encounter: Payer: Self-pay | Admitting: Family Medicine

## 2015-07-25 ENCOUNTER — Ambulatory Visit (INDEPENDENT_AMBULATORY_CARE_PROVIDER_SITE_OTHER): Payer: 59 | Admitting: Psychology

## 2015-07-25 DIAGNOSIS — F4321 Adjustment disorder with depressed mood: Secondary | ICD-10-CM

## 2015-07-30 ENCOUNTER — Encounter: Payer: Self-pay | Admitting: Family Medicine

## 2015-08-14 ENCOUNTER — Telehealth: Payer: Self-pay | Admitting: Family Medicine

## 2015-08-14 MED ORDER — AMPHETAMINE-DEXTROAMPHET ER 20 MG PO CP24: 20.0000 mg | ORAL_CAPSULE | Freq: Every day | ORAL | Status: DC

## 2015-08-14 NOTE — Telephone Encounter (Signed)
Last office visit 06/09/2015.  Last refilled 07/11/2015 for #30 with no refills.  Rx printed and placed in Dr. Elmer SowAron's in box for signature.

## 2015-08-18 NOTE — Telephone Encounter (Signed)
Lm on pts vm and informed him Rx is available for pickup from the front desk 

## 2015-08-19 ENCOUNTER — Encounter: Payer: Self-pay | Admitting: Family Medicine

## 2015-08-20 ENCOUNTER — Other Ambulatory Visit: Payer: Self-pay | Admitting: Family Medicine

## 2015-08-20 MED ORDER — SERTRALINE HCL 50 MG PO TABS: 50.0000 mg | ORAL_TABLET | Freq: Every day | ORAL | Status: DC

## 2015-09-15 ENCOUNTER — Other Ambulatory Visit: Payer: Self-pay | Admitting: Family Medicine

## 2015-09-15 MED ORDER — AMPHETAMINE-DEXTROAMPHET ER 20 MG PO CP24: 20.0000 mg | ORAL_CAPSULE | Freq: Every day | ORAL | 0 refills | Status: DC

## 2015-09-15 NOTE — Telephone Encounter (Signed)
Last f/u 05/2015 

## 2015-09-15 NOTE — Telephone Encounter (Signed)
Lm on pts vm and informed him Rx is available for pickup from the front desk 

## 2015-09-22 ENCOUNTER — Ambulatory Visit (INDEPENDENT_AMBULATORY_CARE_PROVIDER_SITE_OTHER): Payer: 59 | Admitting: Family Medicine

## 2015-09-22 ENCOUNTER — Encounter: Payer: Self-pay | Admitting: Family Medicine

## 2015-09-22 VITALS — BP 114/68 | HR 61 | Temp 97.5°F | Wt 206.5 lb

## 2015-09-22 DIAGNOSIS — F909 Attention-deficit hyperactivity disorder, unspecified type: Secondary | ICD-10-CM

## 2015-09-22 DIAGNOSIS — F4323 Adjustment disorder with mixed anxiety and depressed mood: Secondary | ICD-10-CM

## 2015-09-22 DIAGNOSIS — F988 Other specified behavioral and emotional disorders with onset usually occurring in childhood and adolescence: Secondary | ICD-10-CM

## 2015-09-22 MED ORDER — SERTRALINE HCL 100 MG PO TABS: 100.0000 mg | ORAL_TABLET | Freq: Every day | ORAL | 3 refills | Status: DC

## 2015-09-22 NOTE — Progress Notes (Signed)
Subjective:   Patient ID: Craig Fletcher, male    DOB: 15-Dec-1985, 30 y.o.   MRN: [id number]  Craig Fletcher is a pleasant 30 y.o. year old male who presents to clinic today with Follow-up (discuss zoloft)  on 09/22/2015  HPI:  Anxiety- started zoloft in 05/2015 as he had been feeling panicky and overwhelmed, denied feeling depressed. Currently taking zoloft 50 mg daily.   Felt this dosage was working well- felt less overwhelmed until recently- now feeling more anxious again.  He and his wife are having marital troubles- attending counseling.   Feels adderall dosage is good because he is completing tasks efficiently at work.      No SI or HI.  Appetite good. Wt Readings from Last 3 Encounters:  09/22/15 206 lb 8 oz (93.7 kg)  06/09/15 207 lb 12.8 oz (94.3 kg)  04/16/15 207 lb 12 oz (94.2 kg)   Current Outpatient Prescriptions on File Prior to Visit  Medication Sig Dispense Refill  . amphetamine-dextroamphetamine (ADDERALL XR) 20 MG 24 hr capsule Take 1 capsule (20 mg total) by mouth daily. 30 capsule 0  . sertraline (ZOLOFT) 50 MG tablet Take 1 tablet (50 mg total) by mouth daily. 30 tablet 3   No current facility-administered medications on file prior to visit.     Allergies  Allergen Reactions  . Benadryl [Diphenhydramine Hcl] Other (See Comments)    Hyperactivity    No past medical history on file.  Past Surgical History:  Procedure Laterality Date  . CHEST WALL RECONSTRUCTION      No family history on file.  Social History   Social History  . Marital status: Married    Spouse name: N/A  . Number of children: N/A  . Years of education: N/A   Occupational History  . Not on file.   Social History Main Topics  . Smoking status: Never Smoker  . Smokeless tobacco: Not on file  . Alcohol use Not on file  . Drug use: Unknown  . Sexual activity: Not on file   Other Topics Concern  . Not on file   Social History Narrative  . No narrative on file   The  PMH, PSH, Social History, Family History, Medications, and allergies have been reviewed in [facility], and have been updated if relevant.   Review of Systems  Psychiatric/Behavioral: Positive for sleep disturbance. Negative for agitation, behavioral problems, confusion, decreased concentration, dysphoric mood, hallucinations, self-injury and suicidal ideas. The patient is nervous/anxious. The patient is not hyperactive.   All other systems reviewed and are negative.      Objective:    BP 114/68   Pulse 61   Temp 97.5 F (36.4 C) (Oral)   Wt 206 lb 8 oz (93.7 kg)   SpO2 98%   BMI 28.01 kg/m    Physical Exam  Constitutional: He is oriented to person, place, and time. He appears well-developed and well-nourished. No distress.  HENT:  Head: Normocephalic.  Eyes: Conjunctivae are normal.  Cardiovascular: Normal rate.   Pulmonary/Chest: Effort normal.  Musculoskeletal: Normal range of motion.  Neurological: He is alert and oriented to person, place, and time. No cranial nerve deficit.  Skin: Skin is warm and dry. He is not diaphoretic.  Psychiatric: He has a normal mood and affect. His behavior is normal. Judgment and thought content normal.  Nursing note and vitals reviewed.         Assessment & Plan:   Adjustment disorder with mixed anxiety and depressed  mood  ADD (attention deficit disorder) No Follow-up on file.

## 2015-09-22 NOTE — Patient Instructions (Signed)
Great to see you. Hang in there.  Keep me updated.

## 2015-09-22 NOTE — Progress Notes (Signed)
Pre visit review using our clinic review tool, if applicable. No additional management support is needed unless otherwise documented below in the visit note. 

## 2015-09-22 NOTE — Assessment & Plan Note (Signed)
>  25 minutes spent in face to face time with patient, >50% spent in counselling or coordination of care Was well controlled until recent stressors worsened. Discussed tx options- agreed to increase zoloft to 100 mg daily, continue individual and couples psychotherapy.

## 2015-10-17 ENCOUNTER — Other Ambulatory Visit: Payer: Self-pay | Admitting: Family Medicine

## 2015-10-17 MED ORDER — AMPHETAMINE-DEXTROAMPHET ER 20 MG PO CP24: 20.0000 mg | ORAL_CAPSULE | Freq: Every day | ORAL | 0 refills | Status: DC

## 2015-10-17 NOTE — Telephone Encounter (Signed)
Last f/u 09/2015 

## 2015-10-17 NOTE — Telephone Encounter (Signed)
RX printed and signed and given to WK 

## 2015-10-17 NOTE — Telephone Encounter (Signed)
Spoke to pt and informed him Rx is available for pickup from the front desk 

## 2015-11-21 ENCOUNTER — Other Ambulatory Visit: Payer: Self-pay | Admitting: Internal Medicine

## 2015-11-21 MED ORDER — AMPHETAMINE-DEXTROAMPHET ER 20 MG PO CP24: 20.0000 mg | ORAL_CAPSULE | Freq: Every day | ORAL | 0 refills | Status: DC

## 2015-11-21 NOTE — Telephone Encounter (Signed)
Last written 10-17-15 #30 Last OV 10-02-15.

## 2015-11-21 NOTE — Telephone Encounter (Signed)
Lm on pts vm informing him Rx is available for pickup from the front desk 

## 2015-11-21 NOTE — Telephone Encounter (Signed)
RX printed and signed and given to WK 

## 2015-12-31 ENCOUNTER — Other Ambulatory Visit: Payer: Self-pay | Admitting: Internal Medicine

## 2015-12-31 MED ORDER — AMPHETAMINE-DEXTROAMPHET ER 20 MG PO CP24: 20.0000 mg | ORAL_CAPSULE | Freq: Every day | ORAL | 0 refills | Status: DC

## 2015-12-31 NOTE — Telephone Encounter (Signed)
Last f/u 09/2015 

## 2015-12-31 NOTE — Telephone Encounter (Signed)
Lm on pts vm and informed him Rx is available for pickup from the front desk. Pt advised third party unable to pickup and was advised of extended hours

## 2016-01-02 ENCOUNTER — Encounter: Payer: Self-pay | Admitting: Family Medicine

## 2016-01-05 ENCOUNTER — Encounter: Payer: Self-pay | Admitting: Family Medicine

## 2016-01-19 ENCOUNTER — Encounter: Payer: Self-pay | Admitting: Family Medicine

## 2016-01-22 ENCOUNTER — Other Ambulatory Visit: Payer: Self-pay | Admitting: Family Medicine

## 2016-01-23 MED ORDER — AMPHETAMINE-DEXTROAMPHET ER 20 MG PO CP24: 20.0000 mg | ORAL_CAPSULE | Freq: Every day | ORAL | 0 refills | Status: DC

## 2016-01-23 NOTE — Telephone Encounter (Signed)
Last f/u 09/2015 

## 2016-01-26 MED ORDER — AMPHETAMINE-DEXTROAMPHET ER 20 MG PO CP24: 20.0000 mg | ORAL_CAPSULE | Freq: Every day | ORAL | 0 refills | Status: DC

## 2016-01-26 NOTE — Telephone Encounter (Signed)
Lm on pts vm and informed him Rx is available for pickup from the front desk 

## 2016-01-26 NOTE — Addendum Note (Signed)
Addended by: Desmond DikeKNIGHT, Ladd Cen H on: 01/26/2016 08:49 AM   Modules accepted: Orders

## 2016-02-13 ENCOUNTER — Ambulatory Visit (INDEPENDENT_AMBULATORY_CARE_PROVIDER_SITE_OTHER): Payer: 59 | Admitting: Family Medicine

## 2016-02-13 ENCOUNTER — Encounter: Payer: Self-pay | Admitting: Family Medicine

## 2016-02-13 VITALS — BP 136/74 | HR 89 | Temp 98.3°F | Ht 72.0 in | Wt 212.5 lb

## 2016-02-13 DIAGNOSIS — J019 Acute sinusitis, unspecified: Secondary | ICD-10-CM | POA: Insufficient documentation

## 2016-02-13 DIAGNOSIS — J0110 Acute frontal sinusitis, unspecified: Secondary | ICD-10-CM

## 2016-02-13 MED ORDER — BENZONATATE 200 MG PO CAPS: 200.0000 mg | ORAL_CAPSULE | Freq: Three times a day (TID) | ORAL | 1 refills | Status: DC | PRN

## 2016-02-13 MED ORDER — AMOXICILLIN-POT CLAVULANATE 875-125 MG PO TABS: 1.0000 | ORAL_TABLET | Freq: Two times a day (BID) | ORAL | 0 refills | Status: DC

## 2016-02-13 NOTE — Progress Notes (Signed)
Pre visit review using our clinic review tool, if applicable. No additional management support is needed unless otherwise documented below in the visit note. 

## 2016-02-13 NOTE — Assessment & Plan Note (Signed)
S/p almost 2 wk of viral uri symptoms  Cover with augmentin Disc symptomatic care - see instructions on AVS  Update if not starting to improve in a week or if worsening   Tessalon prn

## 2016-02-13 NOTE — Progress Notes (Signed)
   Subjective:    Patient ID: Craig MerinoJoseph F Sann, male    DOB: [birth year], 30 y.o.   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 Negative for fever.  HENT: Positive for congestion, postnasal drip, rhinorrhea, sinus pressure, sneezing and sore throat. Negative for ear pain.   Eyes: Negative for pain and discharge.  Respiratory: Positive for cough. Negative for shortness of breath, wheezing and stridor.   Cardiovascular: Negative for chest pain.  Gastrointestinal: Negative for diarrhea, nausea and vomiting.  Genitourinary: Negative for frequency, hematuria and urgency.  Musculoskeletal: Negative for arthralgias and myalgias.  Skin: Negative for rash.  Neurological: Positive for headaches. Negative for dizziness, weakness and light-headedness.  Psychiatric/Behavioral: Negative for confusion and dysphoric mood.       Objective:   Physical Exam  Constitutional: He appears well-developed and well-nourished. No distress.  Well appearing with harsh cough    HENT:  Head: Normocephalic and atraumatic.  Mouth/Throat: Oropharynx is clear and moist. No oropharyngeal exudate.  Nares are injected and congested  Bilateral frontal sinus tenderness TMs dull-effusions bilaterally  Throat is clear with pnd   Eyes: Conjunctivae and EOM are normal. Pupils are equal, round, and reactive to light. Right eye exhibits no discharge. Left eye exhibits no discharge.  Neck: Normal range of motion. Neck supple.    Cardiovascular: Normal rate and regular rhythm.   Pulmonary/Chest: Effort normal and breath sounds normal. No respiratory distress. He has no wheezes. He has no rales. He exhibits no tenderness.  Harsh bs  No rales or rhonchi or wheeze Good air exch   Musculoskeletal: Normal range of motion.  Lymphadenopathy:    He has no cervical adenopathy.  Neurological: He is alert. No cranial nerve deficit.  Skin: Skin is warm and dry. No rash noted. No erythema.  Psychiatric: He has a normal mood and affect.          Assessment & Plan:   Problem List Items Addressed This Visit      Respiratory   Acute sinusitis    S/p almost 2 wk of viral uri symptoms  Cover with augmentin Disc symptomatic care - see instructions on AVS  Update if not starting to improve in a week or if worsening   Tessalon prn         Relevant Medications   benzonatate (TESSALON) 200 MG capsule   amoxicillin-clavulanate (AUGMENTIN) 875-125 MG tablet

## 2016-02-13 NOTE — Patient Instructions (Signed)
Take care of yourself  Take the augmentin as directed for sinus infection  Drink lots of fluids and rest  Take the tessalon for cough  mucinex DM max is also ok  Wear a mask for your work   Update if not starting to improve in a week or if worsening

## 2016-02-26 ENCOUNTER — Ambulatory Visit (INDEPENDENT_AMBULATORY_CARE_PROVIDER_SITE_OTHER): Payer: 59 | Admitting: Family Medicine

## 2016-02-26 ENCOUNTER — Encounter: Payer: Self-pay | Admitting: Family Medicine

## 2016-02-26 VITALS — BP 134/78 | HR 80 | Temp 97.9°F | Wt 211.8 lb

## 2016-02-26 DIAGNOSIS — F4323 Adjustment disorder with mixed anxiety and depressed mood: Secondary | ICD-10-CM | POA: Diagnosis not present

## 2016-02-26 MED ORDER — AMPHETAMINE-DEXTROAMPHET ER 20 MG PO CP24: 20.0000 mg | ORAL_CAPSULE | Freq: Every day | ORAL | 0 refills | Status: DC

## 2016-02-26 MED ORDER — BUPROPION HCL ER (XL) 150 MG PO TB24: 150.0000 mg | ORAL_TABLET | Freq: Every day | ORAL | 3 refills | Status: DC

## 2016-02-26 NOTE — Patient Instructions (Signed)
Great to see you. We are starting Wellbutrin 150 mg XL daily.  Please call me with an update or send me in a message in a few weeks.

## 2016-02-26 NOTE — Assessment & Plan Note (Signed)
Deteriorated. >25 minutes spent in face to face time with patient, >50% spent in counselling or coordination of care Discussed tx options.  He would like to try wellbutrin- may be a good option given h/o sexual dysfunction with SSRIs.  I did advise to hold his adderall when he first starts Wellbutrin as they are both stimulants. He will keep me updated. Call or return to clinic prn if these symptoms worsen or fail to improve as anticipated. The patient indicates understanding of these issues and agrees with the plan.

## 2016-02-26 NOTE — Progress Notes (Signed)
   Subjective:   Patient ID: Craig Fletcher, male    DOB: 1985/05/21, 31 y.o.   MRN: [id number]  Craig MerinoJoseph F Fletcher is a pleasant [age] y.o. year old male who presents to clinic today with Follow-up (d/c zoloft appx 1 wk ago)  on 02/26/2016  HPI:  Anxiety- started zoloft in 05/2015 as he had been feeling panicky and overwhelmed, denied feeling depressed. Had increased dose of zoloft to 100 mg daily.   Felt this dosage was working well- felt less overwhelmed until recently- now feeling more anxious again and was having some sexual side effects as well.  He and his wife are having marital troubles- attending counseling.  They are doing better.  He stopped taking zoloft over a week ago.  Wants to "try something else."  No current outpatient prescriptions on file prior to visit.   No current facility-administered medications on file prior to visit.     Allergies  Allergen Reactions  . Benadryl [Diphenhydramine Hcl] Other (See Comments)    Hyperactivity    No past medical history on file.  Past Surgical History:  Procedure Laterality Date  . CHEST WALL RECONSTRUCTION      No family history on file.  Social History   Social History  . Marital status: Married    Spouse name: N/A  . Number of children: N/A  . Years of education: N/A   Occupational History  . Not on file.   Social History Main Topics  . Smoking status: Never Smoker  . Smokeless tobacco: Never Used  . Alcohol use Yes     Comment: rare/occ  . Drug use: No  . Sexual activity: Not on file   Other Topics Concern  . Not on file   Social History Narrative  . No narrative on file   The PMH, PSH, Social History, Family History, Medications, and allergies [facility], and have been updated if relevant.    Review of Systems  Constitutional: Negative.   Gastrointestinal: Negative.   Psychiatric/Behavioral: Positive for decreased concentration and dysphoric mood. Negative for agitation, behavioral  problems, confusion, hallucinations, self-injury, sleep disturbance and suicidal ideas. The patient is nervous/anxious. The patient is not hyperactive.   All other systems reviewed and are negative.      Objective:    BP 134/78   Pulse 80   Temp 97.9 F (36.6 C) (Oral)   Wt 211 lb 12 oz (96 kg)   SpO2 97%   BMI 28.72 kg/m    Physical Exam  Constitutional: He is oriented to person, place, and time. He appears well-developed and well-nourished. No distress.  HENT:  Head: Normocephalic and atraumatic.  Eyes: Conjunctivae are normal.  Cardiovascular: Normal rate.   Pulmonary/Chest: Effort normal.  Musculoskeletal: Normal range of motion.  Neurological: He is alert and oriented to person, place, and time. No cranial nerve deficit.  Skin: Skin is warm and dry. He is not diaphoretic.  Psychiatric: He has a normal mood and affect. His behavior is normal. Judgment and thought content normal.  Nursing note and vitals reviewed.         Assessment & Plan:   Adjustment disorder with mixed anxiety and depressed mood No Follow-up on file.

## 2016-05-03 ENCOUNTER — Telehealth: Payer: Self-pay

## 2016-05-03 MED ORDER — OSELTAMIVIR PHOSPHATE 75 MG PO CAPS: 75.0000 mg | ORAL_CAPSULE | Freq: Every day | ORAL | 0 refills | Status: DC

## 2016-05-03 NOTE — Telephone Encounter (Signed)
Pt wife sent a MyChart message stating they were exposed to the flu over the weekend and would like a rx for Tamiflu

## 2016-05-03 NOTE — Telephone Encounter (Signed)
Tamiflu prophylaxis eRx sent to [facility].

## 2016-07-02 ENCOUNTER — Other Ambulatory Visit: Payer: Self-pay | Admitting: Family Medicine

## 2016-07-02 NOTE — Telephone Encounter (Signed)
Last refill and last OV 02/26/16 #30+3 rfls.

## 2017-03-08 ENCOUNTER — Telehealth: Payer: Self-pay

## 2017-03-08 ENCOUNTER — Ambulatory Visit: Payer: Self-pay

## 2017-03-08 NOTE — Telephone Encounter (Signed)
Patient called in with c/o "swollen testicle." He says "I was working out and noticed some pain to my right testicle. I checked it and felt a lump." I asked what did the lump look like, he said "it's under the skin, no redness, and when I manipulate it, the pain is more noticeable, otherwise it's a 1 on the pain scale." He denies any urinary symptoms, fever. According to protocol, see PCP within 3 days, appointment made for tomorrow 03/09/17 at 0900 with Olean Reeeborah Gessner, FNP, care advice given, he verbalized understanding.  Reason for Disposition . All other penis - scrotum symptoms  (Exception: painless rash < 24 hours duration)  Answer Assessment - Initial Assessment Questions 1. SYMPTOM: "What's the main symptom you're concerned about?" (e.g., discharge from penis, rash, pain, itching, swelling)     Swollen lump on right testicle with pain 2. LOCATION: "Where is the _______ located?"     Right testicle 3. ONSET: "When did ________  start?"     Noticed about 30 minutes ago 4. PAIN: "Is there any pain?" If so, ask: "How bad is it?"  (Scale 1-10; or mild, moderate, severe)     Mild-1 5. URINE: "Any difficulty passing urine?" If so, ask: "When was the last time?"     Denies 6. CAUSE: "What do you think is causing the symptoms?"     Unknown 7. OTHER SYMPTOMS: "Do you have any other symptoms?" (e.g., fever, abdominal pain, blood in urine)     Denies  Protocols used: PENIS AND SCROTUM [id number]-A-AH

## 2017-03-08 NOTE — Telephone Encounter (Signed)
[facility] and Misty StanleyLisa will make that appt but in the meantime can pt be seen for acute visit for swollen, slight pain in testicle that started today. Mandy RN team lead said [facility] and [facility] with the pt understanding the acute visit will only be for one issue with the painful, swollen testicle. Misty StanleyLisa voiced understanding.

## 2017-03-09 ENCOUNTER — Ambulatory Visit: Payer: 59 | Admitting: Family Medicine

## 2017-03-09 ENCOUNTER — Encounter: Payer: Self-pay | Admitting: Family Medicine

## 2017-03-09 VITALS — BP 124/74 | HR 69 | Temp 98.4°F | Wt 223.2 lb

## 2017-03-09 DIAGNOSIS — N509 Disorder of male genital organs, unspecified: Secondary | ICD-10-CM

## 2017-03-09 DIAGNOSIS — N5089 Other specified disorders of the male genital organs: Secondary | ICD-10-CM

## 2017-03-09 NOTE — Patient Instructions (Signed)
It was a pleasure to meet you today.   Please schedule your annual exam in 4-6 months for a check up  Please see Shirlee LimerickMarion about scheduling your ultrasound  Pediatric sleep specialist- Dr. Marcelene Butteraig Canapari

## 2017-03-09 NOTE — Addendum Note (Signed)
Addended by: Tawnya CrookSAMBATH, Davinci Glotfelty on: 03/09/2017 09:28 AM   Modules accepted: Orders

## 2017-03-09 NOTE — Progress Notes (Signed)
   Subjective:    Patient ID: Craig Fletcher, male    DOB: 02-Jan-1986, 32 y.o.   MRN: [id number]  HPI This is a 32 yo male who presents today with testicular swelling. Was working out yesterday and felt a weird sensation in testicle. No real pain, felt a pea sized firm area. No penile discharge, itching, burning. Monogamous relationship with wife. No difficulty urinating. No fever, no enlarged lymph nodes.  Has been a little fatigued lately, works as IT sales professionalfirefighter. Has a 254 yo son.  Has appointment next month to establish care, will do CPE at that visit.   No past medical history on file. Past Surgical History:  Procedure Laterality Date  . CHEST WALL RECONSTRUCTION     No family history on file. Social History   Tobacco Use  . Smoking status: Never Smoker  . Smokeless tobacco: Never Used  Substance Use Topics  . Alcohol use: Yes    Comment: rare/occ  . Drug use: No      Review of Systems Per HPI    Objective:   Physical Exam  Constitutional: He is oriented to person, place, and time. He appears well-developed and well-nourished. No distress.  HENT:  Head: Normocephalic and atraumatic.  Eyes: Conjunctivae are normal.  Cardiovascular: Normal rate.  Pulmonary/Chest: Effort normal.  Genitourinary: Penis normal.    Right testis shows mass and tenderness. Circumcised.  Neurological: He is alert and oriented to person, place, and time.  Skin: Skin is warm and dry. He is not diaphoretic.  Psychiatric: He has a normal mood and affect. His behavior is normal. Judgment and thought content normal.  Vitals reviewed.     BP 124/74   Pulse 69   Temp 98.4 F (36.9 C) (Oral)   Wt 223 lb 4 oz (101.3 kg)   SpO2 95%   BMI 30.28 kg/m  Wt Readings from Last 3 Encounters:  03/09/17 223 lb 4 oz (101.3 kg)  02/26/16 211 lb 12 oz (96 kg)  02/13/16 212 lb 8 oz (96.4 kg)       Assessment & Plan:  1. Testicular mass - US Scrotum; Future - I will notify him of results when  available  - follow up as scheduled next month for CPE/labs  Olean Reeeborah Brenetta Penny, FNP-BC  [facility], [facility]  03/09/2017 9:17 AM

## 2017-03-10 ENCOUNTER — Ambulatory Visit
Admission: RE | Admit: 2017-03-10 | Discharge: 2017-03-10 | Disposition: A | Discharge status: Home / Self Care | Payer: 59 | Source: Ambulatory Visit | Attending: Family Medicine | Admitting: Family Medicine

## 2017-03-10 DIAGNOSIS — N5089 Other specified disorders of the male genital organs: Secondary | ICD-10-CM

## 2017-03-10 DIAGNOSIS — N503 Cyst of epididymis: Secondary | ICD-10-CM | POA: Diagnosis not present

## 2017-03-18 ENCOUNTER — Encounter: Payer: Self-pay | Admitting: Family Medicine

## 2017-03-19 DIAGNOSIS — J019 Acute sinusitis, unspecified: Secondary | ICD-10-CM | POA: Diagnosis not present

## 2017-03-19 DIAGNOSIS — J020 Streptococcal pharyngitis: Secondary | ICD-10-CM | POA: Diagnosis not present

## 2017-03-30 ENCOUNTER — Encounter: Payer: Self-pay | Admitting: Family Medicine

## 2017-03-30 ENCOUNTER — Ambulatory Visit: Payer: 59 | Admitting: Family Medicine

## 2017-03-30 VITALS — BP 122/70 | HR 62 | Temp 97.7°F | Wt 218.0 lb

## 2017-03-30 DIAGNOSIS — Z7689 Persons encountering health services in other specified circumstances: Secondary | ICD-10-CM | POA: Diagnosis not present

## 2017-03-30 NOTE — Patient Instructions (Signed)
Good to see you today  I will notify you of your cholesterol levels via Mychart

## 2017-03-30 NOTE — Progress Notes (Signed)
   Subjective:    Patient ID: Craig Fletcher, male    DOB: 27-Oct-1985, 32 y.o.   MRN: [id number]  HPI This is a 32 yo male who presents today to establish care. Works as a IT sales professionalfirefighter.  Married and has son. Stress level not high. Enjoys fishing. Sleeps well.   Was previously on adderall/sertraline when going through a difficult time following his father's death and suicide of his cousin. Feels like he is doing well without medication now.   Currently on augmentin for URI, strep. Feel much better.   Watching diet- decreasing sweets and processed carbs. Exercises regularly. Had blood work done recently at work. Will send a copy through mychart.    No past medical history on file. Past Surgical History:  Procedure Laterality Date  . CHEST WALL RECONSTRUCTION    No family history on file. Social History   Tobacco Use  . Smoking status: Never Smoker  . Smokeless tobacco: Never Used  Substance Use Topics  . Alcohol use: Yes    Comment: rare/occ  . Drug use: No      Review of Systems  Constitutional: Negative for fatigue and fever.  Respiratory: Negative for cough and shortness of breath.   Cardiovascular: Negative for chest pain.  Psychiatric/Behavioral: Negative for dysphoric mood and sleep disturbance. The patient is not nervous/anxious.        Objective:   Physical Exam Physical Exam  Constitutional: Oriented to person, place, and time. He appears well-developed and well-nourished.  HENT:  Head: Normocephalic and atraumatic.  Eyes: Conjunctivae are normal.  Neck: Normal range of motion. Neck supple.  Cardiovascular: Normal rate, regular rhythm and normal heart sounds.   Pulmonary/Chest: Effort normal and breath sounds normal.  Musculoskeletal: Normal range of motion.  Neurological: Alert and oriented to person, place, and time.  Skin: Skin is warm and dry.  Psychiatric: Normal mood and affect. Behavior is normal. Judgment and thought content normal.  Vitals  reviewed.    BP 122/70   Pulse 62   Temp 97.7 F (36.5 C) (Oral)   Wt 218 lb (98.9 kg)   SpO2 97%   BMI 29.57 kg/m  Wt Readings from Last 3 Encounters:  03/30/17 218 lb (98.9 kg)  03/09/17 223 lb 4 oz (101.3 kg)  02/26/16 211 lb 12 oz (96 kg)        Assessment & Plan:  1. Encounter to establish care  - Discussed and encouraged healthy lifestyle choices- adequate sleep, regular exercise, stress management and healthy food choices.  - He will send me his lab work results to put in EMR. - Follow up in 1 year for CPE   Olean Reeeborah Jaya Lapka, FNP-BC  [facility], [facility]  03/30/2017 10:10 AM

## 2017-11-11 ENCOUNTER — Encounter: Payer: Self-pay | Admitting: Family Medicine

## 2017-11-11 ENCOUNTER — Ambulatory Visit: Payer: Self-pay | Admitting: Family Medicine

## 2017-11-11 ENCOUNTER — Ambulatory Visit: Payer: 59 | Admitting: Family Medicine

## 2017-11-11 ENCOUNTER — Telehealth: Payer: Self-pay | Admitting: Family Medicine

## 2017-11-11 VITALS — BP 120/72 | HR 68 | Temp 98.3°F | Ht 72.0 in | Wt 217.0 lb

## 2017-11-11 DIAGNOSIS — R7989 Other specified abnormal findings of blood chemistry: Secondary | ICD-10-CM

## 2017-11-11 DIAGNOSIS — R5383 Other fatigue: Secondary | ICD-10-CM | POA: Diagnosis not present

## 2017-11-11 DIAGNOSIS — F988 Other specified behavioral and emotional disorders with onset usually occurring in childhood and adolescence: Secondary | ICD-10-CM

## 2017-11-11 MED ORDER — ATOMOXETINE HCL 40 MG PO CAPS: ORAL_CAPSULE | ORAL | 2 refills | Status: DC

## 2017-11-11 NOTE — Progress Notes (Addendum)
Subjective:    Patient ID: Craig Fletcher, male    DOB: 14-Jun-1985, 32 y.o.   MRN: [id number]  HPI This is a 32 yo male who presents today with fatigue x 3 months. Difficulty concentrating, more emotional, upset easily. Now feels overwhelmed with home and job responsibilities. Sleeping well, wakes up tired. Not motivated to get anything done or do things that previously interested him. Went to therapy in the past but did not find it helpful and made him feel worse.  Was on adderall and had more energy but felt increased anxiety (was on while his wife was pregnant). Has also previously been on bupropion (didn't feel that it helped with concentration) and sertraline (had sexual dysfunction).  Had a borderline low testosterone level several years ago. Would like level rechecked.   Report from Dr. Evelene Croon (psychiatrist) notes patient with diagnosis of ADHD primarily hyperactive At that time he had similar complaints to today. Also of note, urology records from 09/23/14 note that patient had borderline low testosterone which was normal on recheck Was not a candidate for testosterone therapy due to potential for having more children. Per urologist, main complaint was fatigue.   No past medical history on file. Past Surgical History:  Procedure Laterality Date  . CHEST WALL RECONSTRUCTION     No family history on file. Social History   Tobacco Use  . Smoking status: Never Smoker  . Smokeless tobacco: Never Used  Substance Use Topics  . Alcohol use: Yes    Comment: rare/occ  . Drug use: No      Review of Systems Per HPI    Objective:   Physical Exam Physical Exam  Vitals reviewed. Constitutional: Oriented to person, place, and time. Appears well-developed and well-nourished.  HENT:  Head: Normocephalic and atraumatic.  Eyes: Conjunctivae are normal.  Neck: Normal range of motion. Neck supple.  Cardiovascular: Normal rate.   Pulmonary/Chest: Effort normal.  Musculoskeletal: Normal  range of motion.  Neurological: Alert and oriented to person, place, and time.  Psychiatric: Normal mood and affect. Behavior is normal. Judgment and thought content normal.      BP 120/72 (BP Location: Left Arm, Patient Position: Sitting, Cuff Size: Large)   Pulse 68   Temp 98.3 F (36.8 C) (Oral)   Ht 6' (1.829 m)   Wt 217 lb (98.4 kg)   SpO2 98%   BMI 29.43 kg/m  Wt Readings from Last 3 Encounters:  11/11/17 217 lb (98.4 kg)  03/30/17 218 lb (98.9 kg)  03/09/17 223 lb 4 oz (101.3 kg)   Depression screen PHQ 2/9 11/14/2017  Decreased Interest 2  Down, Depressed, Hopeless 1  PHQ - 2 Score 3  Altered sleeping 0  Tired, decreased energy 2  Change in appetite 2  Feeling bad or failure about yourself  1  Trouble concentrating 3  Moving slowly or fidgety/restless 1  Suicidal thoughts 0  PHQ-9 Score 12  Difficult doing work/chores Very difficult   GAD 7 : Generalized Anxiety Score 11/14/2017  Nervous, Anxious, on Edge 2  Control/stop worrying 1  Worry too much - different things 2  Trouble relaxing 2  Restless 1  Easily annoyed or irritable 3  Afraid - awful might happen 0  Total GAD 7 Score 11        Assessment & Plan:  1. Attention deficit disorder, unspecified hyperactivity presence - did not tolerate stimulant in past and reports no relief with bupropion, will try non stimulant medication - atomoxetine (STRATTERA)  40 MG capsule; Take one capsule daily in am for 4 days then increase to 2 capsules daily  Dispense: 60 capsule; Refill: 2  2. Other fatigue - chronic intermittent complaint per chart review, will check labs, may need additional intervention if all normal- psychology?  - Testosterone; Future - Comprehensive metabolic panel; Future - TSH; Future - CBC with Differential; Future  3. 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  11/14/2017 8:13 AM

## 2017-11-11 NOTE — Telephone Encounter (Signed)
Please advise 

## 2017-11-11 NOTE — Telephone Encounter (Signed)
Copied from CRM [id number]. Topic: Quick Communication - See Telephone Encounter >> Nov 11, 2017  3:54 PM Jens Som A wrote: CRM for notification. See Telephone encounter for: 11/11/17. Patient is calling regarding atomoxetine (STRATTERA) 40 MG capsule [[id number]]  patient states that the meds are too expensive. Patient is calling for an alternative meds, [phone]

## 2017-11-11 NOTE — Patient Instructions (Signed)
Good to see you today, please schedule an early morning lab only visit  Follow up in 6 weeks  Atomoxetine capsules What is this medicine? ATOMOXETINE (AT oh mox e teen) is used to treat attention deficit/hyperactivity disorder, also known as ADHD. It is not a stimulant like other drugs for ADHD. This drug can improve attention span, concentration, and emotional control. It can also reduce restless or overactive behavior. This medicine may be used for other purposes; ask your health care provider or pharmacist if you have questions. COMMON BRAND NAME(S): Strattera What should I tell my health care provider before I take this medicine? They need to know if you have any of these conditions: -glaucoma -high or low blood pressure -history of stroke -irregular heartbeat or other cardiac disease -liver disease -mania or bipolar disorder -pheochromocytoma -suicidal thoughts -an unusual or allergic reaction to atomoxetine, other medicines, foods, dyes, or preservatives -pregnant or trying to get pregnant -breast-feeding How should I use this medicine? Take this medicine by mouth with a glass of water. Follow the directions on the prescription label. You can take it with or without food. If it upsets your stomach, take it with food. If you have difficulty sleeping and you take more than 1 dose per day, take your last dose before 6 PM. Take your medicine at regular intervals. Do not take it more often than directed. Do not stop taking except on your doctor's advice. A special MedGuide will be given to you by the pharmacist with each prescription and refill. Be sure to read this information carefully each time. Talk to your pediatrician regarding the use of this medicine in children. While this drug may be prescribed for children as young as 6 years for selected conditions, precautions do apply. Overdosage: If you think you have taken too much of this medicine contact a poison control center or  emergency room at once. NOTE: This medicine is only for you. Do not share this medicine with others. What if I miss a dose? If you miss a dose, take it as soon as you can. If it is almost time for your next dose, take only that dose. Do not take double or extra doses. What may interact with this medicine? Do not take this medicine with any of the following medications: -cisapride -dofetilide -dronedarone -MAOIs like Carbex, Eldepryl, Marplan, Nardil, and Parnate -pimozide -reboxetine -thioridazine -ziprasidone This medicine may also interact with the following medications: -certain medicines for blood pressure, heart disease, irregular heart beat -certain medicines for depression, anxiety, or psychotic disturbances -certain medicines for lung disease like albuterol -cold or allergy medicines -fluoxetine -medicines that increase blood pressure like dopamine, dobutamine, or ephedrine -other medicines that prolong the QT interval (cause an abnormal heart rhythm) -paroxetine -quinidine -stimulant medicines for attention disorders, weight loss, or to stay awake This list may not describe all possible interactions. Give your health care provider a list of all the medicines, herbs, non-prescription drugs, or dietary supplements you use. Also tell them if you smoke, drink alcohol, or use illegal drugs. Some items may interact with your medicine. What should I watch for while using this medicine? It may take a week or more for this medicine to take effect. This is why it is very important to continue taking the medicine and not miss any doses. If you have been taking this medicine regularly for some time, do not suddenly stop taking it. Ask your doctor or health care professional for advice. Rarely, this medicine may increase thoughts  of suicide or suicide attempts in children and teenagers. Call your child's health care professional right away if your child or teenager has new or increased  thoughts of suicide or has changes in mood or behavior like becoming irritable or anxious. Regularly monitor your child for these behavioral changes. For males, contact you doctor or health care professional right away if you have an erection that lasts longer than 4 hours or if it becomes painful. This may be a sign of serious problem and must be treated right away to prevent permanent damage. You may get drowsy or dizzy. Do not drive, use machinery, or do anything that needs mental alertness until you know how this medicine affects you. Do not stand or sit up quickly, especially if you are an older patient. This reduces the risk of dizzy or fainting spells. Alcohol can make you more drowsy and dizzy. Avoid alcoholic drinks. Do not treat yourself for coughs, colds or allergies without asking your doctor or health care professional for advice. Some ingredients can increase possible side effects. Your mouth may get dry. Chewing sugarless gum or sucking hard candy, and drinking plenty of water will help. What side effects may I notice from receiving this medicine? Side effects that you should report to your doctor or health care professional as soon as possible: -allergic reactions like skin rash, itching or hives, swelling of the face, lips, or tongue -breathing problems -chest pain -dark urine -fast, irregular heartbeat -general ill feeling or flu-like symptoms -high blood pressure -males: prolonged or painful erection -stomach pain or tenderness -trouble passing urine or change in the amount of urine -vomiting -weight loss -yellowing of the eyes or skin Side effects that usually do not require medical attention (report to your doctor or health care professional if they continue or are bothersome): -change in sex drive or performance -constipation or diarrhea -headache -loss of appetite -menstrual period irregularities -nausea -stomach upset This list may not describe all possible side  effects. Call your doctor for medical advice about side effects. You may report side effects to FDA at 1-800-FDA-1088. Where should I keep my medicine? Keep out of the reach of children. Store at room temperature between 15 and 30 degrees C (59 and 86 degrees F). Throw away any unused medication after the expiration date. NOTE: This sheet is a summary. It may not cover all possible information. If you have questions about this medicine, talk to your doctor, pharmacist, or health care provider.  2018 Elsevier/Gold Standard (2013-06-15 15:29:22)

## 2017-11-14 ENCOUNTER — Encounter: Payer: Self-pay | Admitting: Family Medicine

## 2017-11-14 ENCOUNTER — Other Ambulatory Visit (INDEPENDENT_AMBULATORY_CARE_PROVIDER_SITE_OTHER): Payer: 59

## 2017-11-14 DIAGNOSIS — R5383 Other fatigue: Secondary | ICD-10-CM

## 2017-11-14 DIAGNOSIS — R7989 Other specified abnormal findings of blood chemistry: Secondary | ICD-10-CM

## 2017-11-14 LAB — CBC WITH DIFFERENTIAL/PLATELET
Basophils Absolute: 0 10*3/uL (ref 0.0–0.1)
Basophils Relative: 1.2 % (ref 0.0–3.0)
Eosinophils Absolute: 0.1 10*3/uL (ref 0.0–0.7)
Eosinophils Relative: 1.4 % (ref 0.0–5.0)
HCT: 41.6 % (ref 39.0–52.0)
Hemoglobin: 14 g/dL (ref 13.0–17.0)
Lymphocytes Relative: 27 % (ref 12.0–46.0)
Lymphs Abs: 1.1 10*3/uL (ref 0.7–4.0)
MCHC: 33.6 g/dL (ref 30.0–36.0)
MCV: 85.5 fl (ref 78.0–100.0)
Monocytes Absolute: 0.4 10*3/uL (ref 0.1–1.0)
Monocytes Relative: 9.1 % (ref 3.0–12.0)
Neutro Abs: 2.6 10*3/uL (ref 1.4–7.7)
Neutrophils Relative %: 61.3 % (ref 43.0–77.0)
Platelets: 261 10*3/uL (ref 150.0–400.0)
RBC: 4.87 Mil/uL (ref 4.22–5.81)
RDW: 13.7 % (ref 11.5–15.5)
WBC: 4.2 10*3/uL (ref 4.0–10.5)

## 2017-11-14 LAB — COMPREHENSIVE METABOLIC PANEL
ALT: 20 U/L (ref 0–53)
AST: 25 U/L (ref 0–37)
Albumin: 4.5 g/dL (ref 3.5–5.2)
Alkaline Phosphatase: 36 U/L — ABNORMAL LOW (ref 39–117)
BUN: 18 mg/dL (ref 6–23)
CO2: 30 mEq/L (ref 19–32)
Calcium: 9.5 mg/dL (ref 8.4–10.5)
Chloride: 101 mEq/L (ref 96–112)
Creatinine, Ser: 0.84 mg/dL (ref 0.40–1.50)
GFR: 112.35 mL/min (ref >60.00)
Glucose, Bld: 85 mg/dL (ref 70–99)
Potassium: 4.4 mEq/L (ref 3.5–5.1)
Sodium: 139 mEq/L (ref 135–145)
Total Bilirubin: 0.6 mg/dL (ref 0.2–1.2)
Total Protein: 7.3 g/dL (ref 6.0–8.3)

## 2017-11-14 LAB — TSH: TSH: 2.38 u[IU]/mL (ref 0.35–4.50)

## 2017-11-14 LAB — TESTOSTERONE: Testosterone: 373.75 ng/dL (ref 300.00–890.00)

## 2017-11-14 NOTE — Telephone Encounter (Signed)
Please call and tell him that I sent him a detailed mychart message about medication. Call back if he is unable to access the message.

## 2017-11-14 NOTE — Telephone Encounter (Signed)
Called and spoke with patient. Understanding verbalized nothing further needed at this time.  

## 2017-11-15 ENCOUNTER — Other Ambulatory Visit: Payer: Self-pay | Admitting: Family Medicine

## 2017-11-15 DIAGNOSIS — F4323 Adjustment disorder with mixed anxiety and depressed mood: Secondary | ICD-10-CM

## 2017-11-15 MED ORDER — ESCITALOPRAM OXALATE 10 MG PO TABS: 10.0000 mg | ORAL_TABLET | Freq: Every day | ORAL | 5 refills | Status: DC

## 2017-12-13 ENCOUNTER — Encounter: Payer: Self-pay | Admitting: Family Medicine

## 2017-12-16 ENCOUNTER — Ambulatory Visit: Payer: 59 | Admitting: Family Medicine

## 2017-12-16 ENCOUNTER — Encounter: Payer: Self-pay | Admitting: Family Medicine

## 2017-12-16 VITALS — BP 120/80 | HR 72 | Temp 98.7°F | Ht 72.0 in | Wt 213.8 lb

## 2017-12-16 DIAGNOSIS — F988 Other specified behavioral and emotional disorders with onset usually occurring in childhood and adolescence: Secondary | ICD-10-CM | POA: Diagnosis not present

## 2017-12-16 DIAGNOSIS — F4323 Adjustment disorder with mixed anxiety and depressed mood: Secondary | ICD-10-CM | POA: Diagnosis not present

## 2017-12-16 MED ORDER — BUPROPION HCL ER (XL) 150 MG PO TB24: ORAL_TABLET | ORAL | 1 refills | Status: DC

## 2017-12-16 NOTE — Patient Instructions (Signed)
Good to see you today  I have sent in a prescription for bupropion. You will start with 1 tablet every morning for 7 days then increase to 2 tablets daily (300mg ).   Please touch base with me in 4-6 weeks to let me know how you are doing. I can send in a new prescription for the bupropion 300 mg tablets or we can try Adderall.   If any problems with medication, please get in touch.   Schedule a follow up visit for 3 months   Living With Attention Deficit Hyperactivity Disorder If you have been diagnosed with attention deficit hyperactivity disorder (ADHD), you may be relieved that you now know why you have felt or behaved a certain way. Still, you may feel overwhelmed about the treatment ahead. You may also wonder how to get the support you need and how to deal with the condition day-to-day. With treatment and support, you can live with ADHD and manage your symptoms. How to manage lifestyle changes Managing stress Stress is your body's reaction to life changes and events, both good and bad. To cope with the stress of an ADHD diagnosis, it may help to:  Learn more about ADHD.  Exercise regularly. Even a short daily walk can lower stress levels.  Participate in training or education programs (including social skills training classes) that teach you to deal with symptoms.  Medicines Your health care provider may suggest certain medicines if he or she feels that they will help to improve your condition. Stimulant medicines are usually prescribed to treat ADHD, and therapy may also be prescribed. It is important to:  Avoid using alcohol and other substances that may prevent your medicines from working properly [facility]).  Talk with your pharmacist or health care provider about all the medicines that you take, their possible side effects, and what medicines are safe to take together.  Make it your goal to take part in all treatment decisions (shared decision-making). Ask about possible  side effects of medicines that your health care provider recommends, and tell him or her how you feel about having those side effects. It is best if shared decision-making with your health care provider is part of your total treatment plan.  Relationships To strengthen your relationships with family members while treating your condition, consider taking part in family therapy. You might also attend self-help groups alone or with a loved one. Be honest about how your symptoms affect your relationships. Make an effort to communicate respectfully instead of fighting, and find ways to show others that you care. Psychotherapy may be useful in helping you cope with how ADHD affects your relationships. How to recognize changes in your condition The following signs may mean that your treatment is working well and your condition is improving:  Consistently being on time for appointments.  Being more organized at home and work.  Other people noticing improvements in your behavior.  Achieving goals that you set for yourself.  Thinking more clearly.  The following signs may mean that your treatment is not working very well:  Feeling impatience or more confusion.  Missing, forgetting, or being late for appointments.  An increasing sense of disorganization and messiness.  More difficulty in reaching goals that you set for yourself.  Loved ones becoming angry or frustrated with you.  Where to find support Talking to others  Keep emotion out of important discussions and speak in a calm, logical way.  Listen closely and patiently to your loved ones. Try to understand  their point of view, and try to avoid getting defensive.  Take responsibility for the consequences of your actions.  Ask that others do not take your behaviors personally.  Aim to solve problems as they come up, and express your feelings instead of bottling them up.  Talk openly about what you need from your loved ones and how  they can support you.  Consider going to family therapy sessions or having your family meet with a specialist who deals with ADHD-related behavior problems. Finances Not all insurance plans cover mental health care, so it is important to check with your insurance carrier. If paying for co-pays or counseling services is a problem, search for a local or county mental health care center. Public mental health care services may be offered there at a low cost or no cost when you are not able to see a private health care provider. If you are taking medicine for ADHD, you may be able to get the generic form, which may be less expensive than brand-name medicine. Some makers of prescription medicines also offer help to patients who cannot afford the medicines that they need. Follow these instructions at home:  Take over-the-counter and prescription medicines only as told by your health care provider. Check with your health care provider before taking any new medicines.  Create structure and an organized atmosphere at home. For example: ? Make a list of tasks, then rank them from most important to least important. Work on one task at a time until your listed tasks are done. ? Make a daily schedule and follow it consistently every day. ? Use an appointment calendar, and check it 2 or 3 times a day to keep on track. Keep it with you when you leave the house. ? Create spaces where you keep certain things, and always put things back in their places after you use them.  Keep all follow-up visits as told by your health care provider. This is important. Questions to ask your health care provider:  What are the risks and benefits of taking medicines?  Would I benefit from therapy?  How often should I follow up with a health care provider? Contact a health care provider if:  You have side effects from your medicines, such as: ? Repeated muscle twitches, coughing, or speech outbursts. ? Sleep  problems. ? Loss of appetite. ? Depression. ? New or worsening behavior problems. ? Dizziness. ? Unusually fast heartbeat. ? Stomach pains. ? Headaches. Get help right away if:  You have a severe reaction to a medicine.  Your behavior suddenly gets worse. Summary  With treatment and support, you can live with ADHD and manage your symptoms.  The medicines that are most often prescribed for ADHD are stimulants.  Consider taking part in family therapy or self-help groups with family members or friends.  When you talk with friends and family about your ADHD, be patient and communicate openly.  Take over-the-counter and prescription medicines only as told by your health care provider. Check with your health care provider before taking any new medicines. This information is not intended to replace advice given to you by your health care provider. Make sure you discuss any questions you have with your health care provider. Document Released: 06/03/2016 Document Revised: 06/03/2016 Document Reviewed: 06/03/2016 Elsevier Interactive Patient Education  [employer].

## 2017-12-16 NOTE — Progress Notes (Signed)
Subjective:    Patient ID: Craig Fletcher, male    DOB: Sep 20, 1985, 32 y.o.   MRN: [id number]  HPI This is a 85 you male who presents today for follow up of anxiety/depression, ADHD. Was started on escitalopram 10 mg last month. Some improvement in depression and anxiety. Has been on adderall XR 10-20 mg in past which he stopped when situations that were making it difficult for him to concentrate improved. He is interested in a non stimulant medication. Was on bupropion in past but not in addition to med for depression.   No past medical history on file. Past Surgical History:  Procedure Laterality Date  . CHEST WALL RECONSTRUCTION     No family history on file. Social History   Tobacco Use  . Smoking status: Never Smoker  . Smokeless tobacco: Never Used  Substance Use Topics  . Alcohol use: Yes    Comment: rare/occ  . Drug use: No      Review of Systems Per HPI    Objective:   Physical Exam  Constitutional: He is oriented to person, place, and time. He appears well-developed and well-nourished. No distress.  HENT:  Head: Normocephalic and atraumatic.  Eyes: Conjunctivae are normal.  Cardiovascular: Normal rate.  Pulmonary/Chest: Effort normal.  Neurological: He is alert and oriented to person, place, and time.  Skin: He is not diaphoretic.  Psychiatric: He has a normal mood and affect. His behavior is normal. Judgment and thought content normal.  Vitals reviewed.    BP 120/80 (BP Location: Right Arm, Patient Position: Sitting, Cuff Size: Normal)   Pulse 72   Temp 98.7 F (37.1 C) (Oral)   Ht 6' (1.829 m)   Wt 213 lb 12 oz (97 kg)   SpO2 94%   BMI 28.99 kg/m  Wt Readings from Last 3 Encounters:  12/16/17 213 lb 12 oz (97 kg)  11/11/17 217 lb (98.4 kg)  03/30/17 218 lb (98.9 kg)   Depression screen [facility] 2/9 12/16/2017 11/14/2017  Decreased Interest 1 2  Down, Depressed, Hopeless 1 1  PHQ - 2 Score 2 3  Altered sleeping 0 0  Tired, decreased energy 1 2    Change in appetite 1 2  Feeling bad or failure about yourself  1 1  Trouble concentrating 2 3  Moving slowly or fidgety/restless 0 1  Suicidal thoughts 0 0  PHQ-9 Score 7 12  Difficult doing work/chores Somewhat difficult Very difficult   GAD 7 : Generalized Anxiety Score 12/16/2017 11/14/2017  Nervous, Anxious, on Edge 1 2  Control/stop worrying 1 1  Worry too much - different things 2 2  Trouble relaxing 1 2  Restless 1 1  Easily annoyed or irritable 2 3  Afraid - awful might happen 0 0  Total GAD 7 Score 8 11         Assessment & Plan:  1. Attention deficit disorder, unspecified hyperactivity presence - discussed medication options, unfortunately  - buPROPion (WELLBUTRIN XL) 150 MG 24 hr tablet; Take one tablet by mouth daily for 7 days then increase to 2 tablets daily.  Dispense: 60 tablet; Refill: 1 - I have asked him to touch base with me via mychart in 4 weeks, sooner if any problems with medications or worsening of symptoms  2. Adjustment disorder with mixed anxiety and depressed mood - continue escitalopram, improved GAD/PHQ9 today  - follow up in 3 months   Olean Ree, FNP-BC  [facility] at [facility], [facility]  12/16/2017 11:18 AM

## 2017-12-28 ENCOUNTER — Ambulatory Visit: Payer: 59 | Admitting: Family Medicine

## 2017-12-28 ENCOUNTER — Encounter: Payer: Self-pay | Admitting: Family Medicine

## 2017-12-28 VITALS — BP 102/68 | HR 70 | Temp 98.2°F | Ht 72.0 in | Wt 213.0 lb

## 2017-12-28 DIAGNOSIS — S161XXA Strain of muscle, fascia and tendon at neck level, initial encounter: Secondary | ICD-10-CM | POA: Diagnosis not present

## 2017-12-28 MED ORDER — CYCLOBENZAPRINE HCL 10 MG PO TABS: 5.0000 mg | ORAL_TABLET | Freq: Every evening | ORAL | 0 refills | Status: DC | PRN

## 2017-12-28 MED ORDER — MELOXICAM 15 MG PO TABS: ORAL_TABLET | ORAL | 0 refills | Status: DC

## 2017-12-28 NOTE — Progress Notes (Signed)
   Subjective:    Patient ID: Craig MerinoJoseph F Telford, male    DOB: 12/01/1985, 32 y.o.   MRN: [id number]  HPI This is a 32 yo male who presents today with neck pain x 2 weeks. Woke up with pain, didn't seem to get better. Applied heat, took ibuprofen, acetaminophen with little relief. No numbness or tingling, some pain down left side of neck, pain with turning to right. No weakness. No radiation down back. No headache or visual change.   Started bupropion 12/16/17- starting to see some improvement of mood   No past medical history on file. Past Surgical History:  Procedure Laterality Date  . CHEST WALL RECONSTRUCTION     No family history on file. Social History   Tobacco Use  . Smoking status: Never Smoker  . Smokeless tobacco: Never Used  Substance Use Topics  . Alcohol use: Yes    Comment: rare/occ  . Drug use: No      Review of Systems    per HPI Objective:   Physical Exam  Constitutional: He is oriented to person, place, and time. He appears well-developed and well-nourished. No distress.  HENT:  Head: Normocephalic and atraumatic.  Eyes: Conjunctivae and EOM are normal.  Neck: Normal range of motion. Neck supple. Muscular tenderness (left) present. No spinous process tenderness present. No neck rigidity. No edema, no erythema and normal range of motion present.  Cardiovascular: Normal rate.  Pulmonary/Chest: Effort normal.  Musculoskeletal: Normal range of motion.  Neurological: He is alert and oriented to person, place, and time.  Skin: Skin is warm and dry. He is not diaphoretic.  Psychiatric: He has a normal mood and affect. His behavior is normal. Judgment and thought content normal.  Vitals reviewed.     BP 102/68 (BP Location: Left Arm, Patient Position: Sitting, Cuff Size: Normal)   Pulse 70   Temp 98.2 F (36.8 C) (Oral)   Ht 6' (1.829 m)   Wt 213 lb (96.6 kg)   SpO2 96%   BMI 28.89 kg/m  Wt Readings from Last 3 Encounters:  12/28/17 213 lb (96.6 kg)    12/16/17 213 lb 12 oz (97 kg)  11/11/17 217 lb (98.4 kg)       Assessment & Plan:  1. Strain of neck muscle, initial encounter - Provided written and verbal information regarding diagnosis and treatment. - RTC precautions reviewed - meloxicam (MOBIC) 15 MG tablet; Take 1 tablet daily x 7 days, then daily as needed.  Dispense: 30 tablet; Refill: 0 - cyclobenzaprine (FLEXERIL) 10 MG tablet; Take 0.5-1 tablets (5-10 mg total) by mouth at bedtime as needed for muscle spasms.  Dispense: 20 tablet; Refill: 0   Olean Reeeborah Jazell Rosenau, FNP-BC  [facility], [facility]  12/28/2017 12:18 PM

## 2017-12-28 NOTE — Patient Instructions (Signed)

## 2018-01-20 ENCOUNTER — Other Ambulatory Visit: Payer: Self-pay | Admitting: Family Medicine

## 2018-01-20 DIAGNOSIS — S161XXA Strain of muscle, fascia and tendon at neck level, initial encounter: Secondary | ICD-10-CM

## 2018-01-23 ENCOUNTER — Encounter: Payer: Self-pay | Admitting: Family Medicine

## 2018-01-24 ENCOUNTER — Other Ambulatory Visit: Payer: Self-pay | Admitting: Family Medicine

## 2018-01-24 DIAGNOSIS — F988 Other specified behavioral and emotional disorders with onset usually occurring in childhood and adolescence: Secondary | ICD-10-CM

## 2018-01-24 DIAGNOSIS — Z20828 Contact with and (suspected) exposure to other viral communicable diseases: Secondary | ICD-10-CM

## 2018-01-24 MED ORDER — OSELTAMIVIR PHOSPHATE 75 MG PO CAPS: 75.0000 mg | ORAL_CAPSULE | Freq: Every day | ORAL | 0 refills | Status: DC

## 2018-04-24 ENCOUNTER — Other Ambulatory Visit: Payer: Self-pay | Admitting: Family Medicine

## 2018-04-24 DIAGNOSIS — F988 Other specified behavioral and emotional disorders with onset usually occurring in childhood and adolescence: Secondary | ICD-10-CM

## 2018-04-24 NOTE — Telephone Encounter (Signed)
Please call patient and ask if he is taking bupropion XL? Is he taking 2 a day?

## 2018-04-24 NOTE — Telephone Encounter (Signed)
Please advise on below  

## 2018-04-24 NOTE — Telephone Encounter (Signed)
Left voicemail at [phone] (mobile) and told patient to call office back

## 2018-04-25 ENCOUNTER — Other Ambulatory Visit: Payer: Self-pay | Admitting: Family Medicine

## 2018-04-25 DIAGNOSIS — F4323 Adjustment disorder with mixed anxiety and depressed mood: Secondary | ICD-10-CM

## 2018-04-25 MED ORDER — ESCITALOPRAM OXALATE 10 MG PO TABS: 10.0000 mg | ORAL_TABLET | Freq: Every day | ORAL | 1 refills | Status: DC

## 2018-04-25 MED ORDER — BUPROPION HCL ER (XL) 300 MG PO TB24: 300.0000 mg | ORAL_TABLET | Freq: Every day | ORAL | 1 refills | Status: DC

## 2018-04-25 NOTE — Telephone Encounter (Signed)
Sent mychart message

## 2018-05-31 ENCOUNTER — Ambulatory Visit (INDEPENDENT_AMBULATORY_CARE_PROVIDER_SITE_OTHER): Payer: 59 | Admitting: Family Medicine

## 2018-05-31 ENCOUNTER — Encounter: Payer: Self-pay | Admitting: Family Medicine

## 2018-05-31 DIAGNOSIS — F4323 Adjustment disorder with mixed anxiety and depressed mood: Secondary | ICD-10-CM

## 2018-05-31 MED ORDER — ESCITALOPRAM OXALATE 20 MG PO TABS: 20.0000 mg | ORAL_TABLET | Freq: Every day | ORAL | 1 refills | Status: DC

## 2018-05-31 NOTE — Telephone Encounter (Signed)
Craig Fletcher has Diplomatic Services operational officer scheduled with you today at 3:30 pm.

## 2018-05-31 NOTE — Progress Notes (Signed)
Virtual Visit via Video Note  I connected with Craig Fletcher on 05/31/18 at  3:30 PM EDT by a video enabled telemedicine application and verified that I am speaking with the correct person using two identifiers.   I discussed the limitations of evaluation and management by telemedicine and the availability of in person appointments. The patient expressed understanding and agreed to proceed.  History of Present Illness: This is a 33 yo male who requests virtual visit today to discuss medication.  He is currently on bupropion XL 300 mg and escitalopram 10 mg.  He reports that prior to COVID-19 pandemic he felt like his mood and attention were doing really well.  He feels that his focus is still good but that he is more irritable than usual.  He is struggling trying to juggle his work and home education for his 96-year-old.  His wife is also continuing to work away from the home and they are using grandparents to supplement childcare needs.   Observations/Objective:  GAD 7 : Generalized Anxiety Score 05/31/2018 12/16/2017 11/14/2017  Nervous, Anxious, on Edge 3 1 2   Control/stop worrying 1 1 1   Worry too much - different things 1 2 2   Trouble relaxing 0 1 2  Restless 0 1 1  Easily annoyed or irritable 2 2 3   Afraid - awful might happen 0 0 0  Total GAD 7 Score 7 8 11   Anxiety Difficulty Very difficult - -    Depression screen [facility] 2/9 05/31/2018 12/16/2017 11/14/2017  Decreased Interest 2 1 2   Down, Depressed, Hopeless 2 1 1   PHQ - 2 Score 4 2 3   Altered sleeping 1 0 0  Tired, decreased energy 1 1 2   Change in appetite 0 1 2  Feeling bad or failure about yourself  0 1 1  Trouble concentrating 0 2 3  Moving slowly or fidgety/restless 0 0 1  Suicidal thoughts 0 0 0  PHQ-9 Score 6 7 12   Difficult doing work/chores Somewhat difficult Somewhat difficult Very difficult    Assessment and Plan: 1. Adjustment disorder with mixed anxiety and depressed mood -Discussed going up on his escitalopram  from 10 mg to 20 mg which he just be needed temporarily, new prescription sent to patient's pharmacy -Also provided verbal and written information regarding other ways to manage anxiety; instructions sent via MyChart - escitalopram (LEXAPRO) 20 MG tablet; Take 1 tablet (20 mg total) by mouth daily.  Dispense: 90 tablet; Refill: 1 -Follow-up in 4 to 5 months, sooner if symptoms worsen or do not improve  Olean Ree, FNP-BC  [facility], [facility]  05/31/2018 3:51 PM   Follow Up Instructions: Sent to patient via his MyChart account.   I discussed the assessment and treatment plan with the patient. The patient was provided an opportunity to ask questions and all were answered. The patient agreed with the plan and demonstrated an understanding of the instructions.   The patient was advised to call back or seek an in-person evaluation if the symptoms worsen or if the condition fails to improve as anticipated.  I provided 16 minutes of non-face-to-face time during this encounter.   Emi Belfast, FNP

## 2018-06-27 ENCOUNTER — Other Ambulatory Visit: Payer: Self-pay

## 2018-06-27 ENCOUNTER — Ambulatory Visit (INDEPENDENT_AMBULATORY_CARE_PROVIDER_SITE_OTHER): Payer: 59 | Admitting: Mental Health

## 2018-06-27 DIAGNOSIS — F909 Attention-deficit hyperactivity disorder, unspecified type: Secondary | ICD-10-CM

## 2018-06-27 NOTE — Progress Notes (Signed)
[facility] Counselor Initial Adult Exam  Name: Craig Fletcher Date: 06/27/2018 MRN: [id number] DOB: 03/13/1985 PCP: Emi Belfast, FNP  Time spent:  55 minutes   Virtual Visit via Telephone Note Connected with patient by a video enabled telemedicine/telehealth application or telephone, with their informed consent, and verified patient privacy and that I am speaking with the correct person using two identifiers. I discussed the limitations, risks, security and privacy concerns of performing psychotherapy and management service by telephone and the availability of in person appointments. I also discussed with the patient that there may be a patient responsible charge related to this service. The patient expressed understanding and agreed to proceed. I discussed the treatment planning with the patient. The patient was provided an opportunity to ask questions and all were answered. The patient agreed with the plan and demonstrated an understanding of the instructions. The patient was advised to call  our office if  symptoms worsen or feel they are in a crisis state and need immediate contact.   Therapist Location: home Patient Location: home  Guardian/Payee:  none  Paperwork requested:  n/a  Reason for Visit /Presenting Problem:  Patient presents w/ wife Craig Fletcher. They were in marital counseling about 2 years ago. They stopped b/c pt thought there was bias from the male therapist. They wanted to meet w// a male therapist to work on their communication. They are having communication issues. Were 2 years ago also. Since December 2019, she has been asking him to come back to therapy again. In the last year, their son -age 18 is "high strung, hyper, does not listen well". Pt and his wife are both dx'd w/ AD/HD and they feel their son is also. Their fights come after some frustration w/ parents. She copes w/ OCD, AD/HD. She feels her OCD affects their relationship. Some of her bx's can frustrate  patient. She is in therapy.  They argue in front of their son; she stated he walks away when she tries to talk w/ him. She stated she cannot let things go, she feels they build up over time. She stated he tries to push things off, says "i'm sorry" and be done with it.  Patient stated his father passed away 2 years ago, they did not have a good relationship, never resolved them. He stated his father had his issues, they did not see him often.  He stated he tires to not argue about small issues. They got rid of candy from their son recently, patient decided to give him some the next day but his wife feels they should have talked about it together. Pt acknoledged they should have talked about it together.  They both agree they need to get on the same page more w/ their parenting.  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  Another argument example is the thermostat. He left it on 70 degrees, he denied it when she asked him.  Homework assignment related to once/week quality time, identify "fair fighting rules, and identify independent changes each wants to make.    Mental Status Exam:   Appearance:   Casual     Behavior:  Appropriate  Motor:  Normal  Speech/Language:   Clear and Coherent  Affect:  Appropriate  Mood:  euthymic  Thought process:  normal  Thought content:    WNL  Sensory/Perceptual disturbances:    WNL  Orientation:  x4  Attention:  Good  Concentration:  Good  Memory:  WNL  Fund of knowledge:  Good  Insight:    Good  Judgment:   Good  Impulse Control:  Good   Reported Symptoms:  Anxious, irritability  Risk Assessment: Danger to Self:  No Self-injurious Behavior: No Danger to Others: No Duty to Warn:no Physical Aggression / Violence:No  Access to Firearms a concern: No  Gang Involvement:No  Patient / guardian was educated about steps to take if suicide or homicide risk level increases between visits: yes While future psychiatric events cannot be accurately predicted, the  patient does not currently require acute inpatient psychiatric care and [facility] not [facility].   Diagnoses:    ICD-10-CM   1. Attention deficit hyperactivity disorder (ADHD), unspecified ADHD type F90.9    *Complete part II of assessment next session.  Plan of Care:   1. Patient to engage in individual/family psychotherapy.  2. Patient to learn and apply communication skills and strategies learned in session to improve relationship.  3.  Patient to contact this office, go to the local ED or call 911 if a crisis or emergency develops between visits.   Waldron Sessionhristopher Bolton Canupp, [facility]

## 2018-07-05 ENCOUNTER — Encounter: Payer: Self-pay | Admitting: Family Medicine

## 2018-07-05 ENCOUNTER — Ambulatory Visit (INDEPENDENT_AMBULATORY_CARE_PROVIDER_SITE_OTHER): Payer: 59 | Admitting: Family Medicine

## 2018-07-05 DIAGNOSIS — R37 Sexual dysfunction, unspecified: Secondary | ICD-10-CM

## 2018-07-05 DIAGNOSIS — F4323 Adjustment disorder with mixed anxiety and depressed mood: Secondary | ICD-10-CM

## 2018-07-05 DIAGNOSIS — R6882 Decreased libido: Secondary | ICD-10-CM | POA: Diagnosis not present

## 2018-07-05 DIAGNOSIS — F988 Other specified behavioral and emotional disorders with onset usually occurring in childhood and adolescence: Secondary | ICD-10-CM

## 2018-07-05 NOTE — Progress Notes (Signed)
Virtual Visit via Video Note  I connected with Craig Fletcher on 07/05/18 at 12:00 PM EDT by a video enabled telemedicine application and verified that I am speaking with the correct person using two identifiers.  Location: Patient: in his in laws house [facility]   I discussed the limitations of evaluation and management by telemedicine and the availability of in person appointments. The patient expressed understanding and agreed to proceed.  History of Present Illness: This is a 33 yo male who requests virtual visit today to discuss irritability and difficulty making decisions. We had a virtual visit 05/31/18 and I recommended he go up on escitalopram from 10-20 mg.  He reports that he has not noticed any improvement on the increased dose of escitalopram.  He is also on bupropion XL 300 mg daily.  His wife also contacted me and said that she was concerned because he has impulsive behaviors and is doing things that are putting a strain on their marriage.  He is recently began attending marriage counseling with his wife.  The patient reports that he is more on edge and feels increased stress.  He is finding it difficult to manage things at home during coronavirus pandemic and having his 33-year-old home from school.  His wife works outside the home.  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  He reports that the most stressful relationship is between he and his wife.  He reports that he feels frustration toward his son at times but does not in general feel bad about their relationship.  He reports that his work, parental and and all relationships are fine.  He denies any difficulty performing his job duties.  He is interested in possibly changing medications to see if it would help.  He also reports some decreased libido and sexual performance since going up on escitalopram to 20 mg.  No past medical  history on file. Past Surgical History:  Procedure Laterality Date  . CHEST WALL RECONSTRUCTION     No family history on file. Social History   Tobacco Use  . Smoking status: Never Smoker  . Smokeless tobacco: Never Used  Substance Use Topics  . Alcohol use: Yes    Comment: rare/occ  . Drug use: No      Observations/Objective: The patient is alert.  He answers questions appropriately.  Visible skin is unremarkable.  He is normally conversive and does not appear short of breath.  His mood and affect are appropriate. There were no vitals taken for this visit. Wt Readings from Last 3 Encounters:  12/28/17 213 lb (96.6 kg)  12/16/17 213 lb 12 oz (97 kg)  11/11/17 217 lb (98.4 kg)   Dep[facility] screen [facility] 2/9 07/05/2018 05/31/2018 12/16/2017 11/14/2017  Decreased Interest 2 2 1 2   Down, Depressed, Hopeless 1 2 1 1   PHQ - 2 Score 3 4 2 3   Altered sleeping 0 1 0 0  Tired, decreased energy 2 1 1 2   Change in appetite 2 0 1 2  Feeling bad or failure about yourself  1 0 1 1  Trouble concentrating 1 0 2 3  Moving slowly or fidgety/restless 1 0 0 1  Suicidal thoughts 0 0 0 0  PHQ-9 Score 10 6 7 12   Difficult doing work/chores Somewhat difficult Somewhat difficult Somewhat difficult Very difficult   GAD 7 : Generalized Anxiety Score 07/05/2018 05/31/2018 12/16/2017  11/14/2017  Nervous, Anxious, on Edge 1 3 1 2   Control/stop worrying 2 1 1 1   Worry too much - different things 2 1 2 2   Trouble relaxing 1 0 1 2  Restless 1 0 1 1  Easily annoyed or irritable 2 2 2 3   Afraid - awful might happen 0 0 0 0  Total GAD 7 Score 9 7 8 11   Anxiety Difficulty Very difficult Very difficult - -     Assessment and Plan: 1. Adjustment disorder with mixed anxiety and depressed mood -Reviewed patient's electronic medical record going back several years.  He was previously on Adderall and sertraline.  Given trial of multiple medications, have discussed with patient seeing psychiatry instead of me  adjusting his medications at this point. -He is currently undergoing marriage counseling and will schedule an appointment with a psychiatrist in the same office.  I offered to put in a referral and he will let me know if he requires one or needs any assistance with scheduling.  2. Attention deficit disorder, unspecified hyperactivity presence - see #1  3. Decreased libido -We will decrease his escitalopram from 20 to 10 mg as he did not note improvement of depression and anxiety on increased dose but did have decreased libido and sexual function  4. Sexual dysfunction - see #3   Olean Ree, FNP-BC  [facility] at [facility], [facility]  07/07/2018 2:32 PM   Follow Up Instructions: Visit recap sent to patient via my chart   I discussed the assessment and treatment plan with the patient. The patient was provided an opportunity to ask questions and all were answered. The patient agreed with the plan and demonstrated an understanding of the instructions.   The patient was advised to call back or seek an in-person evaluation if the symptoms worsen or if the condition fails to improve as anticipated.    Emi Belfast, FNP

## 2018-07-07 ENCOUNTER — Encounter: Payer: Self-pay | Admitting: Family Medicine

## 2018-07-12 ENCOUNTER — Ambulatory Visit: Payer: 59 | Admitting: Mental Health

## 2018-07-19 ENCOUNTER — Ambulatory Visit (INDEPENDENT_AMBULATORY_CARE_PROVIDER_SITE_OTHER): Payer: 59 | Admitting: Mental Health

## 2018-07-19 ENCOUNTER — Other Ambulatory Visit: Payer: Self-pay

## 2018-07-19 DIAGNOSIS — F909 Attention-deficit hyperactivity disorder, unspecified type: Secondary | ICD-10-CM

## 2018-07-19 NOTE — Progress Notes (Addendum)
[facility] Counselor/therapist psychotherapy note  Name: Craig Fletcher Date: 07/19/2018 MRN: [id number] DOB: 05-12-1985 PCP: Emi Belfast, FNP  Time spent:  54 minutes   Virtual Visit via Telephone Note Connected with patient by a video enabled telemedicine/telehealth application or telephone, with their informed consent, and verified patient privacy and that I am speaking with the correct person using two identifiers. I discussed the limitations, risks, security and privacy concerns of performing psychotherapy and management service by telephone and the availability of in person appointments. I also discussed with the patient that there may be a patient responsible charge related to this service. The patient expressed understanding and agreed to proceed. I discussed the treatment planning with the patient. The patient was provided an opportunity to ask questions and all were answered. The patient agreed with the plan and demonstrated an understanding of the instructions. The patient was advised to call  our office if  symptoms worsen or feel they are in a crisis state and need immediate contact.   Therapist Location: home Patient Location: home  Mental Status Exam:   Appearance:   Casual     Behavior:  Appropriate  Motor:  Normal  Speech/Language:   Clear and Coherent  Affect:  Appropriate  Mood:  euthymic  Thought process:  normal  Thought content:    WNL  Sensory/Perceptual disturbances:    WNL  Orientation:  x4  Attention:  Good  Concentration:  Good  Memory:  WNL  Fund of knowledge:   Good  Insight:    Good  Judgment:   Good  Impulse Control:  Good   Reported Symptoms:  Anxious, irritability  Risk Assessment: Danger to Self:  No Self-injurious Behavior: No Danger to Others: No Duty to Warn:no Physical Aggression / Violence:No  Access to Firearms a concern: No  Gang Involvement:No  Patient / guardian was educated about steps to take if suicide or homicide risk  level increases between visits: yes While future psychiatric events cannot be accurately predicted, the patient does not currently require acute inpatient psychiatric care and does not currently meet [facility] involuntary commitment criteria.  Subjective: Patient and wife engaged in teletherapy session.  They discussed some recent family issues. wife stated husband told their child to plug in an item in, wife got upset, questioning his judgement. They argued, in front of their son. Argued about his doing housework a lot , she stated she pays the bills and is often busy. theyre planning a beach trip, hoping they will have a good time but are concerned.   Pt stated they set aside some time last night to talk.  He stated he was in "a bad place last week" due to rain, their son wanting to play outside, felt he and his son were having stressful times.  He stated how he feels they were able to talk about the past weeks issues, he admitted he was wrong about some issues. He stated past issues often get brought back up by wife, feels she "writes down all of my mistakes" to use them later. He stated she has tried to work on this, now he wants to spend more time w/ her as a result. She stated she did not feel well over the last few days so she did not bring up the issues. She talks and vents to her mother.  Encouraged patient and wife to continue to follow-up with homework related to fair fighting rules.  Also to identify specifically, individually needs in the relationship.  Interventions: Communication strategies, supportive approaches, problem solving approaches  Diagnoses:  AD/HD by hx Adjustment D/O unspecified  Plan of Care:   1. Patient to engage in individual/family psychotherapy.  2. Patient to learn and apply communication skills and strategies learned in session to improve relationship.  3.  Patient to contact this office, go to the local ED or call 911 if a crisis or emergency develops  between visits.   Waldron Sessionhristopher Kristelle Cavallaro, [facility]

## 2018-07-26 ENCOUNTER — Other Ambulatory Visit: Payer: Self-pay

## 2018-07-26 ENCOUNTER — Ambulatory Visit (INDEPENDENT_AMBULATORY_CARE_PROVIDER_SITE_OTHER): Payer: 59 | Admitting: Mental Health

## 2018-07-26 ENCOUNTER — Encounter: Payer: Self-pay | Admitting: Family Medicine

## 2018-07-26 DIAGNOSIS — F909 Attention-deficit hyperactivity disorder, unspecified type: Secondary | ICD-10-CM | POA: Diagnosis not present

## 2018-07-26 DIAGNOSIS — F4320 Adjustment disorder, unspecified: Secondary | ICD-10-CM | POA: Diagnosis not present

## 2018-07-26 NOTE — Progress Notes (Addendum)
[facility] Counselor/therapist psychotherapy note  Name: DWYANE DUPREE Date: 07/19/18 MRN: [id number] DOB: [birth year] PCP: Elby Beck, FNP  Time spent:  58 minutes   Treatment: Family therapy  Mental Status Exam:   Appearance:   casual  Behavior:  WNL  Motor:  WNL  Speech/Language:   Clear and Coherent  Affect:  Full range  Mood:  Sad, pleasant, anxious  Thought process:  normal  Thought content:    WNL  Sensory/Perceptual disturbances:    WNL  Orientation:  x4  Attention:  Good  Concentration:  Good  Memory:  WNL  Fund of knowledge:   Good  Insight:    Good  Judgment:   Good  Impulse Control:  Good   Reported Symptoms:  Anxious, irritability, distractible, sadness   Risk Assessment: Danger to Self:  No Self-injurious Behavior: No Danger to Others: No Duty to Warn:no Physical Aggression / Violence:No  Access to Firearms a concern: No  Gang Involvement:No  Patient / guardian was educated about steps to take if suicide or homicide risk level increases between visits: yes While future psychiatric events cannot be accurately predicted, the patient does not currently require acute inpatient psychiatric care and does not currently meet [facility] involuntary commitment criteria.  Subjective: Patient and wife engaged in session in office.  They shared interactions over recent family vacation which included both and loss.  Wife stated that she felt undermined at times by her husband going on to give examples.  Patient stated that he did not intend to undermine his wife, sharing his viewpoints.  Assisted them in identifying effective communication.  Encouraged them to utilize communication skills focused on today.  Due to concerns related to establishing clear rules in the home in regards to raising their son which has challenges including ADHD, patient and wife are encouraged to discuss in detail rules for the home and how to follow through consistently with their son.   Both agreed this was an identified need that they need to work on and were agreeable to practicing communication discussed in session.  Both were also in agreement with setting boundaries for the relationship in regards to their parents as they verbalized how this has been disruptive to an extent in their marriage.  Interventions: Communication strategies, supportive approaches, problem solving approaches   Diagnoses:  AD/HD by hx  Plan of Care:   1. Patient to engage in individual/family psychotherapy.  2. Patient to learn and apply communication skills and strategies learned in session to improve relationship.  3.  Patient to contact this office, go to the local ED or call 911 if a crisis or emergency develops between visits.   Anson [place], [facility]

## 2018-07-27 ENCOUNTER — Other Ambulatory Visit: Payer: Self-pay | Admitting: Family Medicine

## 2018-07-27 MED ORDER — BUPROPION HCL ER (XL) 150 MG PO TB24: 300.0000 mg | ORAL_TABLET | Freq: Every day | ORAL | 0 refills | Status: DC

## 2018-08-01 ENCOUNTER — Other Ambulatory Visit: Payer: Self-pay | Admitting: Family Medicine

## 2018-08-01 ENCOUNTER — Telehealth: Payer: Self-pay | Admitting: *Deleted

## 2018-08-01 MED ORDER — BUPROPION HCL ER (XL) 150 MG PO TB24: 150.0000 mg | ORAL_TABLET | Freq: Every day | ORAL | 0 refills | Status: DC

## 2018-08-01 NOTE — Telephone Encounter (Signed)
New prescription for 150 mg x 1 tab daily sent to pharmacy for wean off 300 mg.

## 2018-08-01 NOTE — Telephone Encounter (Signed)
Received fax from [facility] stating: RX was sent for Wellburtrin XL 150 mg to take 2 tabs daily x 14 days but the quantity was for #14 which will only last 7 days.  Please clarify and resend Rx.

## 2018-08-09 ENCOUNTER — Ambulatory Visit (INDEPENDENT_AMBULATORY_CARE_PROVIDER_SITE_OTHER): Payer: 59 | Admitting: Mental Health

## 2018-08-09 ENCOUNTER — Other Ambulatory Visit: Payer: Self-pay

## 2018-08-09 DIAGNOSIS — F909 Attention-deficit hyperactivity disorder, unspecified type: Secondary | ICD-10-CM | POA: Diagnosis not present

## 2018-08-09 NOTE — Progress Notes (Signed)
[facility] Counselor/therapist psychotherapy note  Name: Craig Fletcher Date: 08/09/18 MRN: [id number] DOB: [birth year] PCP: Elby Beck, FNP  Time spent:  58 minutes   Treatment: Family therapy  Mental Status Exam:   Appearance:   casual  Behavior:  WNL  Motor:  WNL  Speech/Language:   Clear and Coherent  Affect:  Full range  Mood:  Sad, pleasant, anxious  Thought process:  normal  Thought content:    WNL  Sensory/Perceptual disturbances:    WNL  Orientation:  x4  Attention:  Good  Concentration:  Good  Memory:  WNL  Fund of knowledge:   Good  Insight:    Good  Judgment:   Good  Impulse Control:  Good   Reported Symptoms:  Anxious, irritability, distractible, sadness   Risk Assessment: Danger to Self:  No Self-injurious Behavior: No Danger to Others: No Duty to Warn:no Physical Aggression / Violence:No  Access to Firearms a concern: No  Gang Involvement:No  Patient / guardian was educated about steps to take if suicide or homicide risk level increases between visits: yes While future psychiatric events cannot be accurately predicted, the patient does not currently require acute inpatient psychiatric care and does not currently meet [facility] involuntary commitment criteria.   Subjective: Patient and wife engaged in session in office. They shared interactions, were able to spend time together on a vacation over the past weekend.  They began to have argument, she stated they had to [facility] in their room, she was tired for about 2 days (child was not sleeping well). She stated pt began mentioning divorce as the arguments escalated. She stated he drank a beer or two, but was also low on his lexapro and was off. They were able to get a refill. By Father's Day they were getting along better per wife. She got upset when they were at a pool and Pt did not keep their son away far enough per wife's report.    Pt stated their vacation started out rough as the [facility] was off, the  room was hot; they were tired the next day, the pool was also closed. They called guest services, he stated she was upset b/c he was not taking the issues serious enough. He stated he eventually mentioned divorce although she has done this in the past.  Wife feels "Alonzo doesn't think I'm smart". She admits "I know it can be hard to control my anger".  Pt stated she wants him to be the "same prior to the pandemic". He stated now, work is more demanding. As soon as he gets off work, he immediately picks up their son, gets home, picks up at the house. Wife then gets home later and they eat and go to bed.  Wife wants more time together. Pt stated they play with their son, hide and go seek, watch movies etc.  She stated she needs to a do more tasks at the home.  Encouraged patient and wife to schedule weekly time together for the next month and to allow each other to speak without interruption setting timer as needed.  Reviewed other communication skills to utilize during discussions.  Interventions: Communication strategies, supportive approaches, problem solving approaches  Diagnoses:  AD/HD by hx  Plan of Care:   1. Patient to engage in individual/family psychotherapy.  2. Patient to learn and apply communication skills and strategies learned in session to improve relationship.  3.  Patient to contact this office, go to the local ED or  call 911 if a crisis or emergency develops between visits.   Waldron Sessionhristopher Brysen Shankman, [facility]

## 2018-08-21 ENCOUNTER — Ambulatory Visit: Payer: 59 | Admitting: Psychiatry

## 2018-08-23 ENCOUNTER — Ambulatory Visit (INDEPENDENT_AMBULATORY_CARE_PROVIDER_SITE_OTHER): Payer: 59 | Admitting: Psychiatry

## 2018-08-23 ENCOUNTER — Ambulatory Visit: Payer: 59 | Admitting: Mental Health

## 2018-08-23 ENCOUNTER — Other Ambulatory Visit: Payer: Self-pay

## 2018-08-23 ENCOUNTER — Encounter: Payer: Self-pay | Admitting: Psychiatry

## 2018-08-23 DIAGNOSIS — F419 Anxiety disorder, unspecified: Secondary | ICD-10-CM

## 2018-08-23 MED ORDER — BUSPIRONE HCL 15 MG PO TABS: ORAL_TABLET | ORAL | 1 refills | Status: DC

## 2018-08-23 NOTE — Progress Notes (Signed)
[facility] MD/PA/NP Initial Note Craig MerinoJoseph F Fletcher [id number] [birth year] 33 y.o.  Virtual Visit via Telephone Note  I connected with pt on 08/23/18 at 12:45 PM EDT by telephone and verified that I am speaking with the correct person using two identifiers.   I discussed the limitations, risks, security and privacy concerns of performing an evaluation and management service by telephone and the availability of in person appointments. I also discussed with the patient that there may be a patient responsible charge related to this service. The patient expressed understanding and agreed to proceed.   I discussed the assessment and treatment plan with the patient. The patient was provided an opportunity to ask questions and all were answered. The patient agreed with the plan and demonstrated an understanding of the instructions.   The patient was advised to call back or seek an in-person evaluation if the symptoms worsen or if the condition fails to improve as anticipated.  I provided 60 minutes of non-face-to-f ace time during this encounter.  The patient was located at work.  [facility]   08/23/2018 7:11 PM Craig MerinoJoseph F Negash  MRN:  [id number]  Chief Complaint:  Chief Complaint    Anxiety; Other      HPI: Pt is a 33 yo male seen for initial evaluation for anxiety, impaired concentration, and depression. He reports that he is being seen for initial evaluation for medication management since he has had inadequate. He reports that he has been experiencing some "agitation" and stress in response to the pandemic.Reports that he has been losing patience more easily. He reports that he could feel himself getting agitated with increased heart rate.   Reports that his mood has been better after taking on a part-time. He reports that his irritability has improved with decreasing Wellbutrin XL. Reports that he is now able to step away when irritable. Denies depressed mood.  He reports having anxiety in the context of marital stress and some anticipatory anxiety prior to wife coming home. Denies excessive worry. He reports that he has some physical s/s of anxiety with increased heart rate. Denies any h/o panic attacks. Reports feeling "overwhelmed" at times with managing multiple responsibilities, working, child care, etc.   Denies any perfectionistic thinking. Denies any obsessions or compulsions.   He reports that he is sleeping well. Denies any h/o difficulty falling or staying asleep. He reports low motivation at times. He reports that his energy is better at work than at home. He reports that his appetite is ok. Reports that he is not been eating as healthy. He reports that he is less interested in things. Denies anhedonia and is looking forward to upcoming beach trip. Notices he sometimes withdraws from others, to include his son, despite wanting to be close.  Denies SI.   He reports that his mother has said he had some inattentive s/s. He reports that he would get in trouble for talking in class. Had some difficulty with staying in his seat. Reports long-standing h/o procrastination. Reports that he typically is able to start and complete tasks. Concentration has not been as good with decrease in Wellbutrin XL. Reports that he will frequently lose and misplace objects. Denies difficulty keeping up with appointments and will use reminders in his phone. Reports that he will interrupt others.  He reports that he had period of impulsivity (spent $1,000 on a stereo without discussing with wife and put some money on the credit card), decreased sleep, elevated mood, increased  energy and goal-directed activity. He reports that this happened on a particular medication and a couple of months after his father's death. Reports shortly after this time he went to the beach for a bachelor party and was somewhat impulsive and drank more than usual. These episodes happened in 2016.    Denies AH, VH, or paranoia.   Pt is caring for his 796 yo son on his days off. Pt reports that he works for the [employer]. Has started a part-time job.   He and his wife have been [facility], [facility].   [place]. Parents divorced when he was young. Mother moved to [place] when she re-married when pt was 33 yo. Step-father worked frequently. Reports that he has not able to spend time with his father and did not have the best relationship. Reports that father was an alcoholic.  Has one step-sister. Married x 8 years. They have a son that is almost 346 yo.   Past Psychiatric Medication Trials: Wellbutrin- Helpful for concentration. He reports that it has helped some with depression. Reports that wife thinks this causes him to be "on edge." Was on 300 mg. Lexapro- Sexual side effects. Was on 20 mg and was reduced to 10 mg po qd this past month. Side effects have improved some at lower dose. Ineffective.  Sertraline- Sexual side effects Adderall XR- Had to be doing something all the time and was "overly focused." Had difficulty sleeping at higher dose. Stopped 5-6 years Adderall- Had insomnia and increased energy Strattera- did not start due to cost  Visit Diagnosis:    ICD-10-CM   1. Anxiety disorder, unspecified type  F41.9 busPIRone (BUSPAR) 15 MG tablet    Past Psychiatric History:   Past Medical History: History reviewed. No pertinent past medical history.  Past Surgical History:  Procedure Laterality Date  . CHEST WALL RECONSTRUCTION        Family History:  Family History  Problem Relation Age of Onset  . Depression Mother   . Alcohol abuse Father     Social History:  Social History   Socioeconomic History  . Marital status: Married    Spouse name: Not on file  . Number of children: Not on file  . Years of education: Not on file  . Highest education level: Not on file  Occupational History  . Not on file   Social Needs  . Financial resource strain: Not on file  . Food insecurity    Worry: Not on file    Inability: Not on file  . Transportation needs    Medical: Not on file    Non-medical: Not on file  Tobacco Use  . Smoking status: Never Smoker  . Smokeless tobacco: Never Used  Substance and Sexual Activity  . Alcohol use: Yes    Comment: rare/occ  . Drug use: No  . Sexual activity: Not on file  Lifestyle  . Physical activity    Days per week: Not on file    Minutes per session: Not on file  . Stress: Not on file  Relationships  . Social Musicianconnections    Talks on phone: Not on file    Gets together: Not on file    Attends religious service: Not on file    Active member of club or organization: Not on file    Attends meetings of clubs or organizations: Not on file    Relationship status: Not on file  Other Topics Concern  .  Not on file  Social History Narrative  . Not on file    Allergies: No Known Allergies  Metabolic Disorder Labs: No results found for: HGBA1C, MPG No results found for: PROLACTIN No results found for: CHOL, TRIG, HDL, CHOLHDL, VLDL, LDLCALC Lab Results  Component Value Date   TSH 2.38 11/14/2017   TSH 2.21 09/07/2012    Therapeutic Level Labs: No results found for: LITHIUM No results found for: VALPROATE No components found for:  CBMZ  Current Medications: Current Outpatient Medications  Medication Sig Dispense Refill  . busPIRone (BUSPAR) 15 MG tablet Take 1/3 tablet p.o. twice daily for 1 week, then take 2/3 tablet p.o. twice daily for 1 week, then take 1 tablet p.o. twice daily 60 tablet 1   No current facility-administered medications for this visit.     Medication Side Effects: anxiety and dry mouth  Orders placed this visit:  No orders of the defined types were placed in this encounter.   Psychiatric Specialty Exam:  Review of Systems  Psychiatric/Behavioral:       Please see HPI  All other systems reviewed and are  negative.   There were no vitals taken for this visit.There is no height or weight on file to calculate BMI.  General Appearance: unable to assess  Eye Contact:  unable to assess  Speech:  Clear and Coherent and Normal Rate  Volume:  Normal  Mood:  Anxious  Affect:  unable to assess  Thought Process:  Coherent  Orientation:  Full (Time, Place, and Person)  Thought Content: Logical   Suicidal Thoughts:  No  Homicidal Thoughts:  No  Memory:  WNL  Judgement:  Good  Insight:  Good  Psychomotor Activity:  Normal  Concentration:  Concentration: Good  Recall:  Good  Fund of Knowledge: Good  Language: Good  Assets:  Communication Skills Desire for Improvement Resilience  ADL's:  Intact  Cognition: WNL  Prognosis:  Good   Screenings:  GAD-7     Office Visit from 07/05/2018 in [place] at [place] from 05/31/2018 in [place] at [place] Visit from 12/16/2017 in [place] at [facility] Visit from 11/11/2017 in [place] at [facility]  Total GAD-7 Score  9  7  8  11     PHQ2-9     Office Visit from 07/05/2018 in [place] at [place] from 05/31/2018 in [place] at [facility] Visit from 12/16/2017 in [place] at [facility] Visit from 11/11/2017 in [place] at [facility] Total Score  3  4  2  3   PHQ-9 Total Score  10  6  7  12       Receiving Psychotherapy: Yes   Treatment Plan/Recommendations: Pt seen for 60 minutes and greater than 50% of visit spent counseling pt re: tx options. Discussed that anxiety may be exacerbating concentration difficulties and mood. Discussed potential benefits, risks, and side effects of Buspar. Pt agrees to start of Buspar for anxiety. Start BuSpar 15 mg 1/3 tablet twice daily for 1 week, then increase to 2/3 tablet twice daily for 1 week, then increase to 1 tablet twice daily for anxiety. Discussed considering  methylphenidate based stimulant if concentration does not improve with Buspar. Recommend continuing marital therapy with Lanetta Inch, LPC. Pt to f/u with this provider in 6 weeks or sooner if clinically indicated.    Thayer Headings, PMHNP

## 2018-08-24 ENCOUNTER — Ambulatory Visit (INDEPENDENT_AMBULATORY_CARE_PROVIDER_SITE_OTHER): Payer: 59 | Admitting: Mental Health

## 2018-08-24 ENCOUNTER — Other Ambulatory Visit: Payer: Self-pay

## 2018-08-24 DIAGNOSIS — F419 Anxiety disorder, unspecified: Secondary | ICD-10-CM | POA: Diagnosis not present

## 2018-08-24 DIAGNOSIS — F909 Attention-deficit hyperactivity disorder, unspecified type: Secondary | ICD-10-CM | POA: Diagnosis not present

## 2018-08-24 NOTE — Progress Notes (Signed)
[facility] Counselor/therapist psychotherapy note  Name: Craig Fletcher Date: 08/24/18 MRN: [id number] DOB: 06-25-1985 PCP: Elby Beck, FNP  Time spent:  57 minutes   Treatment: Family therapy  Virtual Visit via Telephone Note Connected with patient by a video enabled telemedicine/telehealth application or telephone, with their informed consent, and verified patient privacy and that I am speaking with the correct person using two identifiers. I discussed the limitations, risks, security and privacy concerns of performing psychotherapy and management service by telephone and the availability of in person appointments. I also discussed with the patient that there may be a patient responsible charge related to this service. The patient expressed understanding and agreed to proceed. I discussed the treatment planning with the patient. The patient was provided an opportunity to ask questions and all were answered. The patient agreed with the plan and demonstrated an understanding of the instructions. The patient was advised to call  our office if  symptoms worsen or feel they are in a crisis state and need immediate contact.   Therapist Location: home Patient Location: home  Mental Status Exam:   Appearance:   casual  Behavior:  WNL  Motor:  WNL  Speech/Language:   Clear and Coherent  Affect:  Full range  Mood:  Sad, pleasant, anxious  Thought process:  normal  Thought content:    WNL  Sensory/Perceptual disturbances:    WNL  Orientation:  x4  Attention:  Good  Concentration:  Good  Memory:  WNL  Fund of knowledge:   Good  Insight:    Good  Judgment:   Good  Impulse Control:  Good   Reported Symptoms:  Anxious, irritability, distractible, sadness, distractible, some problems w/ focus  Risk Assessment: Danger to Self:  No Self-injurious Behavior: No Danger to Others: No Duty to Warn:no Physical Aggression / Violence:No  Access to Firearms a concern: No  Gang  Involvement:No  Patient / guardian was educated about steps to take if suicide or homicide risk level increases between visits: yes While future psychiatric events cannot be accurately predicted, the patient does not currently require acute inpatient psychiatric care and does not currently meet [facility] involuntary commitment criteria.   Subjective: Patient and wife engaged in telehealth session. They shared recent disagreement; wife's concern about Pt letting their 33 y/o go into the garage unsupervised. Pt stated he admitted that he shouldn't have allowed him to do this, but that his wife kept the argument going. She admitted yelling is something she could have changed. He stated he is trying to have their son do more tasks on his own to increase independence. Pt admits he gets defensive and shuts down when she confronts him often.  He stated when she comes home, she is often stressed and agitated. Begins to complain or be critical. Pt stated this affects him and their son.  She stated she has initiated time together w/ pt, but he feel asleep.  Both were encouraged to review separately their own communication blocks they bring to the relationship. They plan to make more quality time together and agreed to verbalize at least one compliment per day to the other.  Interventions: Communication strategies, supportive approaches, problem solving approaches  Diagnoses:  AD/HD by hx  Plan of Care:   1. Patient to engage in individual/family psychotherapy.  2. Patient to learn and apply communication skills and strategies learned in session to improve relationship.  3.  Patient to contact this office, go to the local ED or call  911 if a crisis or emergency develops between visits.   Waldron Sessionhristopher Leib Elahi, [facility]

## 2018-09-06 ENCOUNTER — Ambulatory Visit (INDEPENDENT_AMBULATORY_CARE_PROVIDER_SITE_OTHER): Payer: 59 | Admitting: Mental Health

## 2018-09-06 ENCOUNTER — Other Ambulatory Visit: Payer: Self-pay

## 2018-09-06 DIAGNOSIS — F909 Attention-deficit hyperactivity disorder, unspecified type: Secondary | ICD-10-CM | POA: Diagnosis not present

## 2018-09-06 NOTE — Progress Notes (Signed)
[facility] Counselor/therapist psychotherapy note  Name: Craig MerinoJoseph F Fletcher Date: 09/06/18 MRN: [id number] DOB: [birth year] PCP: Emi BelfastGessner, Deborah B, FNP  Time spent:  57 minutes   Treatment: Family therapy  Virtual Visit via Telephone Note Connected with patient by a video enabled telemedicine/telehealth application or telephone, with their informed consent, and verified patient privacy and that I am speaking with the correct person using two identifiers. I discussed the limitations, risks, security and privacy concerns of performing psychotherapy and management service by telephone and the availability of in person appointments. I also discussed with the patient that there may be a patient responsible charge related to this service. The patient expressed understanding and agreed to proceed. I discussed the treatment planning with the patient. The patient was provided an opportunity to ask questions and all were answered. The patient agreed with the plan and demonstrated an understanding of the instructions. The patient was advised to call  our office if  symptoms worsen or feel they are in a crisis state and need immediate contact.   Therapist Location: home Patient Location: home  Mental Status Exam:   Appearance:   casual  Behavior:  WNL  Motor:  WNL  Speech/Language:   Clear and Coherent  Affect:  Full range  Mood:  Sad, pleasant, anxious  Thought process:  normal  Thought content:    WNL  Sensory/Perceptual disturbances:    WNL  Orientation:  x4  Attention:  Good  Concentration:  Good  Memory:  WNL  Fund of knowledge:   Good  Insight:    Good  Judgment:   Good  Impulse Control:  Good   Reported Symptoms:  Anxious, irritability, distractible, sadness, distractible, some problems w/ focus  Risk Assessment: Danger to Self:  No Self-injurious Behavior: No Danger to Others: No Duty to Warn:no Physical Aggression / Violence:No  Access to Firearms a concern: No  Gang  Involvement:No  Patient / guardian was educated about steps to take if suicide or homicide risk level increases between visits: yes While future psychiatric events cannot be accurately predicted, the patient does not currently require acute inpatient psychiatric care and does not currently meet [facility] involuntary commitment criteria.   Subjective: Patient and wife engaged in telehealth session. Pt stated they went to the beach last week, went well overall. She stated she was upset the first night due to Pt having too many drinks per her report. Stated he was intoxicated and later stated he was sorry and would stop. She has offered to quit any drinking if it would help him stop. Her parents were their w/ them at the beach and stated they were going to leave due to his bx. She stated that when he drinks, they argue. She stated he becomes irrational. She wanted him to apologize and needed her prompting to do so. She stated she is concerned due to his family hx of alcohol abuse. She stated he drank 2 beers the next day. He admitted to drinking too much that day and w/ his medication, enhanced the effects of the alcohol.  Both were encouraged to review separately their own communication blocks they bring to the relationship. They plan to make more quality time together and agreed to verbalize at least one compliment per day to the other. Discussed empowering language to use between sessions, gave examples.  Interventions: Communication strategies, supportive approaches, problem solving approaches  Diagnoses:  AD/HD by hx  Plan of Care:   1. Patient to engage in individual/family psychotherapy.  2. Patient to learn and apply communication skills and strategies learned in session to improve relationship.  3.  Patient to contact this office, go to the local ED or call 911 if a crisis or emergency develops between visits.   Anson Oregon, [facility]

## 2018-09-20 ENCOUNTER — Ambulatory Visit (INDEPENDENT_AMBULATORY_CARE_PROVIDER_SITE_OTHER): Payer: 59 | Admitting: Mental Health

## 2018-09-20 ENCOUNTER — Other Ambulatory Visit: Payer: Self-pay

## 2018-09-20 ENCOUNTER — Ambulatory Visit: Payer: 59 | Admitting: Mental Health

## 2018-09-20 DIAGNOSIS — F909 Attention-deficit hyperactivity disorder, unspecified type: Secondary | ICD-10-CM | POA: Diagnosis not present

## 2018-09-20 DIAGNOSIS — F419 Anxiety disorder, unspecified: Secondary | ICD-10-CM | POA: Diagnosis not present

## 2018-09-20 NOTE — Progress Notes (Signed)
[facility] Counselor/therapist psychotherapy note  Name: Craig Fletcher Date: 09/20/18  MRN: [id number] DOB: 1985-12-02 PCP: Elby Beck, FNP  Time spent:  56 minutes   Treatment: Family therapy  Virtual Visit via Telephone Note Connected with patient by a video enabled telemedicine/telehealth application or telephone, with their informed consent, and verified patient privacy and that I am speaking with the correct person using two identifiers. I discussed the limitations, risks, security and privacy concerns of performing psychotherapy and management service by telephone and the availability of in person appointments. I also discussed with the patient that there may be a patient responsible charge related to this service. The patient expressed understanding and agreed to proceed. I discussed the treatment planning with the patient. The patient was provided an opportunity to ask questions and all were answered. The patient agreed with the plan and demonstrated an understanding of the instructions. The patient was advised to call  our office if  symptoms worsen or feel they are in a crisis state and need immediate contact.   Therapist Location: home Patient Location: home  Mental Status Exam:   Appearance:   casual  Behavior:  WNL  Motor:  WNL  Speech/Language:   Clear and Coherent  Affect:  Full range  Mood:  Sad, pleasant, anxious  Thought process:  normal  Thought content:    WNL  Sensory/Perceptual disturbances:    WNL  Orientation:  x4  Attention:  Good  Concentration:  Good  Memory:  WNL  Fund of knowledge:   Good  Insight:    Good  Judgment:   Good  Impulse Control:  Good   Reported Symptoms:  Anxious, irritability, distractible, sadness, distractible, some problems w/ focus  Risk Assessment: Danger to Self:  No Self-injurious Behavior: No Danger to Others: No Duty to Warn:no Physical Aggression / Violence:No  Access to Firearms a concern: No  Gang  Involvement:No  Patient / guardian was educated about steps to take if suicide or homicide risk level increases between visits: yes While future psychiatric events cannot be accurately predicted, the patient does not currently require acute inpatient psychiatric care and does not currently meet [facility] involuntary commitment criteria.    Subjective: Patient and wife engaged in telehealth session. Wife stated she is frustrated w/ Pt's schedule, not spending time w/ the family. He stated the 2nd job went from 5 to [place] as planned. Wife is upset the days fall on the days she is off work and therefore he has not time w/ family as much. He stated he is still home about 18 days out of the month. She feels he does not want to be around her.  He stated she continues to come home from work immediately complaining about something.  Engaged them in communication to identify and restate on others sentiments and intentions.  During session by having them identify strengths of the other, specifically efforts to make on a daily basis that affect him positively as a couple as well as a family.  Encouraged him to continue between sessions.  Reviewed "fair fighting rules" encouraging them to identify some specific areas it could improve on in terms of their communication individually.  Interventions: Communication strategies, supportive approaches, problem solving approaches  Diagnoses:  AD/HD by hx  Plan of Care:   1. Patient to engage in individual/family psychotherapy.  2. Patient to learn and apply communication skills and strategies learned in session to improve relationship.  3.  Patient to contact this office, go  to the local ED or call 911 if a crisis or emergency develops between visits.   [name], [facility]

## 2018-10-04 ENCOUNTER — Other Ambulatory Visit: Payer: Self-pay

## 2018-10-04 ENCOUNTER — Encounter: Payer: Self-pay | Admitting: Psychiatry

## 2018-10-04 ENCOUNTER — Ambulatory Visit (INDEPENDENT_AMBULATORY_CARE_PROVIDER_SITE_OTHER): Payer: 59 | Admitting: Psychiatry

## 2018-10-04 ENCOUNTER — Ambulatory Visit (INDEPENDENT_AMBULATORY_CARE_PROVIDER_SITE_OTHER): Payer: 59 | Admitting: Mental Health

## 2018-10-04 DIAGNOSIS — F419 Anxiety disorder, unspecified: Secondary | ICD-10-CM

## 2018-10-04 DIAGNOSIS — F909 Attention-deficit hyperactivity disorder, unspecified type: Secondary | ICD-10-CM

## 2018-10-04 MED ORDER — BUSPIRONE HCL 15 MG PO TABS: 15.0000 mg | ORAL_TABLET | Freq: Two times a day (BID) | ORAL | 1 refills | Status: DC

## 2018-10-04 NOTE — Progress Notes (Signed)
Craig MerinoJoseph F Ballantine [id number] [birth year] 33 y.o.  Virtual Visit via Telephone Note  I connected with pt on 10/04/18 at  8:30 AM EDT by telephone and verified that I am speaking with the correct person using two identifiers.   I discussed the limitations, risks, security and privacy concerns of performing an evaluation and management service by telephone and the availability of in person appointments. I also discussed with the patient that there may be a patient responsible charge related to this service. The patient expressed understanding and agreed to proceed.   I discussed the assessment and treatment plan with the patient. The patient was provided an opportunity to ask questions and all were answered. The patient agreed with the plan and demonstrated an understanding of the instructions.   The patient was advised to call back or seek an in-person evaluation if the symptoms worsen or if the condition fails to improve as anticipated.  I provided 25 minutes of non-face-to-face time during this encounter.  The patient was located at home.  [facility]   Corie ChiquitoJessica Kayleana Waites, PMHNP   Subjective:   Patient ID:  Craig MerinoJoseph F Malkowski is a 33 y.o. (DOB [birth year]) male.  Chief Complaint:  Chief Complaint  Patient presents with  . Follow-up    ADD, Anxiety    HPI Craig MerinoJoseph F Mcenaney presents for follow-up of ADD and anxiety. He reports "I feel a lot better" with Buspar. He reports that he has not been on full dose very long and thinks he has been on full dose about 2 weeks. He reports that his concentration has improved and is now adequate. He reports that anxiety is "getting better" and reports that now anxiety is typically related to situational stressors and what he would consider to be expected. He reports that he is more patient. Denies any episodes of increased heart rate. He reports that his mood is generally better at work compared to home, and mood is improving  some at home. He reports some sad mood in response to not being able to resume usual activities. He reports that he is frustrated and occ feels overwhelmed when he cannot take care of things around the home that he wishes he could so due to caring for his son and other responsibilities. He denies severe irritability. He reports that he has been sleeping well. Appetite has been fine. Energy and motivation have been good. Denies anhedonia and enjoys his work and being with son. Denies SI.   He reports that he had a difficult day on Monday after he and his wife had a disagreement and he was hurt by comments wife made.  Denies impulsivity.   Son has started [employer] for the school year. Reports that son "is very emotionally immature" and plans to discuss this with pediatrician today. He reports that he has made "more attempts" to improve relationship.    Past Psychiatric Medication Trials: Wellbutrin- Helpful for concentration. He reports that it has helped some with depression. Reports that wife thinks this causes him to be "on edge." Was on 300 mg. Lexapro- Sexual side effects. Was on 20 mg and was reduced to 10 mg po qd this past month. Side effects have improved some at lower dose. Ineffective.  Sertraline- Sexual side effects Adderall XR- Had to be doing something all the time and was "overly focused." Had difficulty sleeping at higher dose. Stopped 5-6 years Adderall- Had insomnia and increased energy Strattera- did not start due to cost  Review of Systems:  Review of Systems  Musculoskeletal: Negative for gait problem.  Neurological: Negative for dizziness and tremors.  Psychiatric/Behavioral:       Please refer to HPI    Medications: I have reviewed the patient's current medications.  Current Outpatient Medications  Medication Sig Dispense Refill  . busPIRone (BUSPAR) 15 MG tablet Take 1 tablet (15 mg total) by mouth 2 (two) times daily. 60 tablet 1   No current  facility-administered medications for this visit.     Medication Side Effects: None  Allergies: No Known Allergies  History reviewed. No pertinent past medical history.  Family History  Problem Relation Age of Onset  . Depression Mother   . Alcohol abuse Father     Social History   Socioeconomic History  . Marital status: Married    Spouse name: Not on file  . Number of children: Not on file  . Years of education: Not on file  . Highest education level: Not on file  Occupational History  . Not on file  Social Needs  . Financial resource strain: Not on file  . Food insecurity    Worry: Not on file    Inability: Not on file  . Transportation needs    Medical: Not on file    Non-medical: Not on file  Tobacco Use  . Smoking status: Never Smoker  . Smokeless tobacco: Never Used  Substance and Sexual Activity  . Alcohol use: Yes    Comment: rare/occ  . Drug use: No  . Sexual activity: Not on file  Lifestyle  . Physical activity    Days per week: Not on file    Minutes per session: Not on file  . Stress: Not on file  Relationships  . Social Herbalist on phone: Not on file    Gets together: Not on file    Attends religious service: Not on file    Active member of club or organization: Not on file    Attends meetings of clubs or organizations: Not on file    Relationship status: Not on file  . Intimate partner violence    Fear of current or ex partner: Not on file    Emotionally abused: Not on file    Physically abused: Not on file    Forced sexual activity: Not on file  Other Topics Concern  . Not on file  Social History Narrative  . Not on file    Past Medical History, Surgical history, Social history, and Family history were reviewed and updated as appropriate.   Please see review of systems for further details on the patient's review from today.   Objective:   Physical Exam:  There were no vitals taken for this visit.  Physical  Exam Constitutional:      General: He is not in acute distress.    Appearance: He is well-developed.  Musculoskeletal:        General: No deformity.  Neurological:     Mental Status: He is alert and oriented to person, place, and time.     Coordination: Coordination normal.  Psychiatric:        Attention and Perception: Attention and perception normal. He does not perceive auditory or visual hallucinations.        Mood and Affect: Mood normal. Mood is not anxious or depressed. Affect is not labile, blunt, angry or inappropriate.        Speech: Speech normal.  Behavior: Behavior normal.        Thought Content: Thought content normal. Thought content does not include homicidal or suicidal ideation. Thought content does not include homicidal or suicidal plan.        Cognition and Memory: Cognition and memory normal.        Judgment: Judgment normal.     Comments: Insight intact. No delusions.      Lab Review:     Component Value Date/Time   NA 139 11/14/2017 0827   K 4.4 11/14/2017 0827   CL 101 11/14/2017 0827   CO2 30 11/14/2017 0827   GLUCOSE 85 11/14/2017 0827   BUN 18 11/14/2017 0827   CREATININE 0.84 11/14/2017 0827   CALCIUM 9.5 11/14/2017 0827   PROT 7.3 11/14/2017 0827   ALBUMIN 4.5 11/14/2017 0827   AST 25 11/14/2017 0827   ALT 20 11/14/2017 0827   ALKPHOS 36 (L) 11/14/2017 0827   BILITOT 0.6 11/14/2017 0827       Component Value Date/Time   WBC 4.2 11/14/2017 0827   RBC 4.87 11/14/2017 0827   HGB 14.0 11/14/2017 0827   HCT 41.6 11/14/2017 0827   PLT 261.0 11/14/2017 0827   MCV 85.5 11/14/2017 0827   MCHC 33.6 11/14/2017 0827   RDW 13.7 11/14/2017 0827   LYMPHSABS 1.1 11/14/2017 0827   MONOABS 0.4 11/14/2017 0827   EOSABS 0.1 11/14/2017 0827   BASOSABS 0.0 11/14/2017 0827    No results found for: POCLITH, LITHIUM   No results found for: PHENYTOIN, PHENOBARB, VALPROATE, CBMZ   .res Assessment: Plan:   Patient seen for 30 minutes and greater  than 50% of visit spent counseling patient regarding response to medication and discussing expectations of medications and discerning if situational frustration and anxiety are appropriate/excessive or expected considering situational stress.  Patient reports that he feels BuSpar is helpful for his anxiety, agitation, and concentration and that now when he experiences some anxiety and frustration it is in response to identifiable stressors and what he would consider to be expected.  He reports that he is also now able to better control his responses in these situations.  Agreed that not experiencing any situational stress would not be healthy or helpful.  Patient would like to continue current dose of medication and allow a full month on BuSpar 15 mg twice daily to determine response since patient has not been at full dose for very long and may continue to see benefit.  Discussed that BuSpar is approved at doses higher than 15 mg twice daily and that BuSpar could be increased if signs and symptoms are not fully controlled. Recommend continuing psychotherapy with Elio Forgethris Andrews, LPC. Patient to follow-up with this provider in 4 to 6 weeks or sooner if clinically indicated. Patient advised to contact office with any questions, adverse effects, or acute worsening in signs and symptoms.  Jomarie LongsJoseph was seen today for follow-up.  Diagnoses and all orders for this visit:  Anxiety disorder, unspecified type -     busPIRone (BUSPAR) 15 MG tablet; Take 1 tablet (15 mg total) by mouth 2 (two) times daily.  Attention deficit hyperactivity disorder (ADHD), unspecified ADHD type    Please see After Visit Summary for patient specific instructions.  Future Appointments  Date Time Provider Department Center  10/04/2018  5:00 PM Waldron Sessionndrews, Christopher, [facility] None  10/18/2018  4:00 PM Waldron SessionAndrews, Christopher, [facility] None    No orders of the defined types were placed in this encounter.      -------------------------------

## 2018-10-04 NOTE — Progress Notes (Signed)
[facility] Counselor/therapist psychotherapy note  Name: Craig MerinoJoseph F Iannaccone Date: 10/04/18  MRN: [id number] DOB: 1985-04-10 PCP: Emi BelfastGessner, Deborah B, FNP  Time spent:  58 minutes   Treatment: Family therapy  Virtual Visit via Telephone Note Connected with patient by a video enabled telemedicine/telehealth application or telephone, with their informed consent, and verified patient privacy and that I am speaking with the correct person using two identifiers. I discussed the limitations, risks, security and privacy concerns of performing psychotherapy and management service by telephone and the availability of in person appointments. I also discussed with the patient that there may be a patient responsible charge related to this service. The patient expressed understanding and agreed to proceed. I discussed the treatment planning with the patient. The patient was provided an opportunity to ask questions and all were answered. The patient agreed with the plan and demonstrated an understanding of the instructions. The patient was advised to call  our office if  symptoms worsen or feel they are in a crisis state and need immediate contact.   Therapist Location: home Patient Location: home  Mental Status Exam:   Appearance:   casual  Behavior:  WNL  Motor:  WNL  Speech/Language:   Clear and Coherent  Affect:  Full range  Mood:  Sad, pleasant, anxious  Thought process:  normal  Thought content:    WNL  Sensory/Perceptual disturbances:    WNL  Orientation:  x4  Attention:  Good  Concentration:  Good  Memory:  WNL  Fund of knowledge:   Good  Insight:    Good  Judgment:   Good  Impulse Control:  Good   Reported Symptoms:  Anxious, irritability, distractible, sadness, distractible, some problems w/ focus  Risk Assessment: Danger to Self:  No Self-injurious Behavior: No Danger to Others: No Duty to Warn:no Physical Aggression / Violence:No  Access to Firearms a concern: No  Gang  Involvement:No  Patient / guardian was educated about steps to take if suicide or homicide risk level increases between visits: yes While future psychiatric events cannot be accurately predicted, the patient does not currently require acute inpatient psychiatric care and does not currently meet [facility] criteria.    Subjective: Patient and wife engaged in telehealth session. She stated she asked him to a towel and patient got and "snapped at me". She shared how she is coping w/ some recent medication changes and needs him to be emotionally supportive and helpful during this time. She stated he was supportive, hugged her. She stated he was frustrated when she went to her parents when he was at work (24 hour shift). He stated there were several times were she told him to leave. He denied having an issue w/ her going to her parents house. He stated there were several occurrences where he attempted to spend time w/ her, but she pulled away from him. She stated her med changes have affected her mood.  Wife stated she is frustrated w/ Pt's schedule, not spending time w/ the family. He stated the 2nd job went from 5 to 3/month as planned. Wife is upset the days fall on the days she is off work and therefore he has not time w/ family as much. He stated he is still home about 18 days out of the month.  She feels he does not want to be around her.  He stated she continues to come home from work immediately complaining about something.  They plan to make more quality time together  and agreed to verbalize at least one compliment per day to the other. Reviewed empowering language to use between sessions, gave examples.  Interventions: Communication strategies, supportive approaches, problem solving approaches  Diagnoses:  AD/HD by hx  Plan of Care:   1. Patient to engage in individual/family psychotherapy.  2. Patient to learn and apply communication skills and strategies learned in  session to improve relationship.  3.  Patient to contact this office, go to the local ED or call 911 if a crisis or emergency develops between visits.   Anson [place], [facility]

## 2018-10-18 ENCOUNTER — Other Ambulatory Visit: Payer: Self-pay

## 2018-10-18 ENCOUNTER — Ambulatory Visit (INDEPENDENT_AMBULATORY_CARE_PROVIDER_SITE_OTHER): Payer: 59 | Admitting: Mental Health

## 2018-10-18 ENCOUNTER — Ambulatory Visit (INDEPENDENT_AMBULATORY_CARE_PROVIDER_SITE_OTHER): Payer: 59

## 2018-10-18 DIAGNOSIS — F909 Attention-deficit hyperactivity disorder, unspecified type: Secondary | ICD-10-CM | POA: Diagnosis not present

## 2018-10-18 DIAGNOSIS — Z23 Encounter for immunization: Secondary | ICD-10-CM

## 2018-10-18 NOTE — Progress Notes (Signed)
[facility] Counselor/therapist psychotherapy note  Name: BOND GRIESHOP Date: 10/18/18  MRN: [id number] DOB: 01-08-1986 PCP: Elby Beck, FNP  Time spent:  58 minutes   Treatment: Family therapy  Virtual Visit via Telephone Note Connected with patient by a video enabled telemedicine/telehealth application or telephone, with their informed consent, and verified patient privacy and that I am speaking with the correct person using two identifiers. I discussed the limitations, risks, security and privacy concerns of performing psychotherapy and management service by telephone and the availability of in person appointments. I also discussed with the patient that there may be a patient responsible charge related to this service. The patient expressed understanding and agreed to proceed. I discussed the treatment planning with the patient. The patient was provided an opportunity to ask questions and all were answered. The patient agreed with the plan and demonstrated an understanding of the instructions. The patient was advised to call  our office if  symptoms worsen or feel they are in a crisis state and need immediate contact.   Therapist Location: home Patient Location: home  Mental Status Exam:   Appearance:   casual  Behavior:  WNL  Motor:  WNL  Speech/Language:   Clear and Coherent  Affect:  Full range  Mood:  Sad, pleasant, anxious  Thought process:  normal  Thought content:    WNL  Sensory/Perceptual disturbances:    WNL  Orientation:  x4  Attention:  Good  Concentration:  Good  Memory:  WNL  Fund of knowledge:   Good  Insight:    Good  Judgment:   Good  Impulse Control:  Good   Reported Symptoms:  Anxious, irritability, distractible, sadness, distractible, some problems w/ focus  Risk Assessment: Danger to Self:  No Self-injurious Behavior: No Danger to Others: No Duty to Warn:no Physical Aggression / Violence:No  Access to Firearms a concern: No  Gang  Involvement:No  Patient / guardian was educated about steps to take if suicide or homicide risk level increases between visits: yes While future psychiatric events cannot be accurately predicted, the patient does not currently require acute inpatient psychiatric care and does not currently meet [facility] involuntary commitment criteria.    Subjective: Patient engaged in telehealth session.  He stated they have had a very challenging day due to their son's disruptive behaviors.  Stated that their son got upset when being given directions eventually leading to his throwing a remote and breaking a TV.  He shared the challenges of his son having to be home schooled due to the pandemic.  Shared the challenges he and his wife continue to have with her communication, continued arguments where he stated she will verbalize feelings of resentment if he does not take initiative to make time with family.  Patient stated he makes time often for family only when not working.  They are considering having the son evaluated due to his ongoing behavioral issues.  Ways to communicate effectively in the relationship were discussed.  Plans to follow through identifying strengths and effort in their relationship with 1 another.    Interventions: Communication strategies, supportive approaches, problem solving approaches  Diagnoses:  AD/HD by hx  Plan of Care:   1. Patient to engage in individual/family psychotherapy.  2. Patient to learn and apply communication skills and strategies learned in session to improve relationship.  3.  Patient to contact this office, go to the local ED or call 911 if a crisis or emergency develops between visits.  Anson Oregon, [facility]

## 2018-11-01 ENCOUNTER — Ambulatory Visit: Payer: 59 | Admitting: Mental Health

## 2018-11-08 ENCOUNTER — Other Ambulatory Visit: Payer: Self-pay

## 2018-11-08 ENCOUNTER — Ambulatory Visit (INDEPENDENT_AMBULATORY_CARE_PROVIDER_SITE_OTHER): Payer: 59 | Admitting: Mental Health

## 2018-11-08 DIAGNOSIS — F909 Attention-deficit hyperactivity disorder, unspecified type: Secondary | ICD-10-CM | POA: Diagnosis not present

## 2018-11-08 NOTE — Progress Notes (Signed)
[facility] Counselor/therapist psychotherapy note  Name: NNAEMEKA SAMSON Date: 11/08/18  MRN: [id number] DOB: 11-Nov-1985 PCP: Elby Beck, FNP  Time spent:  58 minutes   Treatment: Family therapy  Mental Status Exam:   Appearance:   casual  Behavior:  WNL  Motor:  WNL  Speech/Language:   Clear and Coherent  Affect:  Full range  Mood:  Sad, pleasant, anxious  Thought process:  normal  Thought content:    WNL  Sensory/Perceptual disturbances:    WNL  Orientation:  x4  Attention:  Good  Concentration:  Good  Memory:  WNL  Fund of knowledge:   Good  Insight:    Good  Judgment:   Good  Impulse Control:  Good   Reported Symptoms:  Anxious, irritability, distractible, sadness, distractible, some problems w/ focus  Risk Assessment: Danger to Self:  No Self-injurious Behavior: No Danger to Others: No Duty to Warn:no Physical Aggression / Violence:No  Access to Firearms a concern: No  Gang Involvement:No  Patient / guardian was educated about steps to take if suicide or homicide risk level increases between visits: yes While future psychiatric events cannot be accurately predicted, the patient does not currently require acute inpatient psychiatric care and does not currently meet [facility] involuntary commitment criteria.    Subjective: Patient engaged in session with his wife.  He shared positive changes recently regarding their son's behavior.  They stated that they have decided to see a psychiatrist and he has started medication.  They have noticed that he has become less aggressive and has follow directions with increased frequency and consistency.  They gave various examples of this.  They went on to share some relational stress they have had which they feels also can impact their son's behavior at times.  Patient shared how he is trying to increase his efficiency at home, tackling tasks and projects while trying to balance time with family.  Patient and wife were able  to discuss differences as well as needs and expectations.  Patient shared he needs her to "back him up" regarding their son as he feels at times she undermines his efforts by stating that he is choosing not to spend time with family when he has to get tasks done such as cutting the grass.  Patient shared how he plans to try to be more mindful of some statements he may make and frustration that are hurtful to his wife.  Ways to communicate effectively in the relationship were discussed.   Interventions: Communication strategies, supportive approaches, problem solving approaches  Diagnoses:  AD/HD by hx  Plan of Care:   1. Patient to engage in individual/family psychotherapy.  2. Patient to learn and apply communication skills and strategies learned in session to improve relationship.  3.  Patient to contact this office, go to the local ED or call 911 if a crisis or emergency develops between visits.   Anson [place], [facility]

## 2018-11-29 ENCOUNTER — Ambulatory Visit (INDEPENDENT_AMBULATORY_CARE_PROVIDER_SITE_OTHER): Payer: 59 | Admitting: Mental Health

## 2018-11-29 ENCOUNTER — Other Ambulatory Visit: Payer: Self-pay

## 2018-11-29 ENCOUNTER — Ambulatory Visit: Payer: 59 | Admitting: Mental Health

## 2018-11-29 DIAGNOSIS — F909 Attention-deficit hyperactivity disorder, unspecified type: Secondary | ICD-10-CM | POA: Diagnosis not present
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
## 2018-12-01 ENCOUNTER — Ambulatory Visit (INDEPENDENT_AMBULATORY_CARE_PROVIDER_SITE_OTHER): Payer: 59 | Admitting: Psychiatry

## 2018-12-01 ENCOUNTER — Other Ambulatory Visit: Payer: Self-pay

## 2018-12-01 ENCOUNTER — Encounter: Payer: Self-pay | Admitting: Psychiatry

## 2018-12-01 DIAGNOSIS — F419 Anxiety disorder, unspecified: Secondary | ICD-10-CM

## 2018-12-01 DIAGNOSIS — F909 Attention-deficit hyperactivity disorder, unspecified type: Secondary | ICD-10-CM

## 2018-12-01 MED ORDER — PROPRANOLOL HCL 10 MG PO TABS: ORAL_TABLET | ORAL | 1 refills | Status: DC

## 2018-12-01 MED ORDER — BUSPIRONE HCL 15 MG PO TABS: ORAL_TABLET | ORAL | 1 refills | Status: DC

## 2018-12-01 NOTE — Progress Notes (Signed)
Craig Fletcher [id number] May 19, 1985 33 y.o.  Virtual Visit via Telephone Note  I connected with pt on 12/01/18 at  8:30 AM EDT by telephone and verified that I am speaking with the correct person using two identifiers.   I discussed the limitations, risks, security and privacy concerns of performing an evaluation and management service by telephone and the availability of in person appointments. I also discussed with the patient that there may be a patient responsible charge related to this service. The patient expressed understanding and agreed to proceed.   I discussed the assessment and treatment plan with the patient. The patient was provided an opportunity to ask questions and all were answered. The patient agreed with the plan and demonstrated an understanding of the instructions.   The patient was advised to call back or seek an in-person evaluation if the symptoms worsen or if the condition fails to improve as anticipated.  I provided 30 minutes of non-face-to-face time during this encounter.  The patient was located at home.  The provider was located at [place].   Thayer Headings, PMHNP   Subjective:   Patient ID:  Craig Fletcher is a 33 y.o. (DOB 1985/02/21) male.  Chief Complaint:  Chief Complaint  Patient presents with  . Other    Irritation, frustration    HPI Craig Fletcher presents for follow-up of anxiety and irritability. Recently learned step-father has just been dx'd with cancer and dementia. He reports that he is wanting to help parents and they are requesting help, however wife is requesting him to be at home instead. He reports that "I think me and my wife are on the path to divorce right now." He reports that he has also had stress in response to pandemic. He reports that stress is manifesting as anger and irritability at times and is asking for medication change/addition to assist with this. He reports that he has sought an individual counselor in  addition to marital therapy. He reports that having marital therapy via telephone visits is less than ideal since he is distracted by their son being there, however wife requests tele-visit instead of in person visits. He reports having difficulty coping with recent stressors.   He reports "a couple of bouts of getting really angry." He reports that he and his wife recently had a disagreement and he felt agitated.He denies panic s/s. "The only time I feel anxiety is when my wife and I get into it." Denies excessive worry or rumination. Reports that he is able to focus on work and being with his son. He reports sad mood in response to marital stress. He reports that he has been eating more unhealthy foods recently. Energy and motivation have been fine. Concentration has been adequate.  Patient reports, "I think my concentration may be the best it has ever been."  He reports occ difficulty falling asleep following marital conflict. Denies SI.   He is currently taking classes in addition to working and reports that his grades have been good.   Past Psychiatric Medication Trials: Wellbutrin- Helpful for concentration. He reports that it has helped some with depression. Reports that wife thinks this causes him to be "on edge." Was on 300 mg. Lexapro- Sexual side effects. Was on 20 mg and was reduced to 10 mg po qd this past month. Side effects have improved some at lower dose. Ineffective.  Sertraline- Sexual side effects Adderall XR- Had to be doing something all the time and was "overly focused."  Had difficulty sleeping at higher dose. Stopped 5-6 years Adderall- Had insomnia and increased energy Strattera- did not start due to cost  Reports that son has been responding well to Guanfacine.   Review of Systems:  Review of Systems  Gastrointestinal: Negative.   Musculoskeletal: Negative for gait problem.  Neurological: Positive for headaches. Negative for tremors.  Psychiatric/Behavioral:        Please refer to HPI    Medications: I have reviewed the patient's current medications.  Current Outpatient Medications  Medication Sig Dispense Refill  . busPIRone (BUSPAR) 15 MG tablet Take 1.5 tabs po BID x 1 week, then increase to 2 tabs po BID 60 tablet 1  . propranolol (INDERAL) 10 MG tablet Take 1-2 tabs po BID prn anxiety 120 tablet 1   No current facility-administered medications for this visit.     Medication Side Effects: None  Allergies: No Known Allergies  History reviewed. No pertinent past medical history.  Family History  Problem Relation Age of Onset  . Depression Mother   . Alcohol abuse Father     Social History   Socioeconomic History  . Marital status: Married    Spouse name: Not on file  . Number of children: Not on file  . Years of education: Not on file  . Highest education level: Not on file  Occupational History  . Not on file  Social Needs  . Financial resource strain: Not on file  . Food insecurity    Worry: Not on file    Inability: Not on file  . Transportation needs    Medical: Not on file    Non-medical: Not on file  Tobacco Use  . Smoking status: Never Smoker  . Smokeless tobacco: Never Used  Substance and Sexual Activity  . Alcohol use: Yes    Comment: rare/occ  . Drug use: No  . Sexual activity: Not on file  Lifestyle  . Physical activity    Days per week: Not on file    Minutes per session: Not on file  . Stress: Not on file  Relationships  . Social Musicianconnections    Talks on phone: Not on file    Gets together: Not on file    Attends religious service: Not on file    Active member of club or organization: Not on file    Attends meetings of clubs or organizations: Not on file    Relationship status: Not on file  . Intimate partner violence    Fear of current or ex partner: Not on file    Emotionally abused: Not on file    Physically abused: Not on file    Forced sexual activity: Not on file  Other Topics Concern  .  Not on file  Social History Narrative  . Not on file    Past Medical History, Surgical history, Social history, and Family history were reviewed and updated as appropriate.   Please see review of systems for further details on the patient's review from today.   Objective:  Patient reports that his resting heart rate is typically in the upper 60s to low 70s.  Physical Exam:  There were no vitals taken for this visit.  Physical Exam Neurological:     Mental Status: He is alert and oriented to person, place, and time.     Cranial Nerves: No dysarthria.  Psychiatric:        Attention and Perception: Attention normal.        Speech: Speech  normal.        Behavior: Behavior is cooperative.        Thought Content: Thought content normal. Thought content is not paranoid or delusional. Thought content does not include homicidal or suicidal ideation. Thought content does not include homicidal or suicidal plan.        Cognition and Memory: Cognition and memory normal.        Judgment: Judgment normal.     Comments: Mood is appropriate to content.     Lab Review:     Component Value Date/Time   NA 139 11/14/2017 0827   K 4.4 11/14/2017 0827   CL 101 11/14/2017 0827   CO2 30 11/14/2017 0827   GLUCOSE 85 11/14/2017 0827   BUN 18 11/14/2017 0827   CREATININE 0.84 11/14/2017 0827   CALCIUM 9.5 11/14/2017 0827   PROT 7.3 11/14/2017 0827   ALBUMIN 4.5 11/14/2017 0827   AST 25 11/14/2017 0827   ALT 20 11/14/2017 0827   ALKPHOS 36 (L) 11/14/2017 0827   BILITOT 0.6 11/14/2017 0827       Component Value Date/Time   WBC 4.2 11/14/2017 0827   RBC 4.87 11/14/2017 0827   HGB 14.0 11/14/2017 0827   HCT 41.6 11/14/2017 0827   PLT 261.0 11/14/2017 0827   MCV 85.5 11/14/2017 0827   MCHC 33.6 11/14/2017 0827   RDW 13.7 11/14/2017 0827   LYMPHSABS 1.1 11/14/2017 0827   MONOABS 0.4 11/14/2017 0827   EOSABS 0.1 11/14/2017 0827   BASOSABS 0.0 11/14/2017 0827    No results found for:  POCLITH, LITHIUM   No results found for: PHENYTOIN, PHENOBARB, VALPROATE, CBMZ   .res Assessment: Plan:   Patient seen for 30 minutes and greater than 50% of visit spent counseling patient regarding potential benefits, risks, and side effects of possible treatment options to include increasing BuSpar, adding propanolol as needed for anxiety, and possibly adding a medication such as Viibryd for treatment of anxiety due to low risk of sexual side effects. Patient agrees to increase in BuSpar and reports that he has multiple 15 mg tabs on hand.  Therefore recommend increasing BuSpar to 15 mg 1-1/2 tabs p.o. twice daily for 1 week, then increasing to 2 tabs twice daily for anxiety and irritability. Patient agrees to trial of propanolol.  Advised patient to contact office and stop propanolol if dizziness or lightheadedness occur. Patient to follow-up in 4 to 6 weeks or sooner if clinically indicated. Recommend continuing psychotherapy. Patient advised to contact office with any questions, adverse effects, or acute worsening in signs and symptoms.  Sahas was seen today for other.  Diagnoses and all orders for this visit:  Anxiety disorder, unspecified type -     propranolol (INDERAL) 10 MG tablet; Take 1-2 tabs po BID prn anxiety -     busPIRone (BUSPAR) 15 MG tablet; Take 1.5 tabs po BID x 1 week, then increase to 2 tabs po BID    Please see After Visit Summary for patient specific instructions.  Future Appointments  Date Time Provider Department Center  12/20/2018  9:00 AM May, [place], [facility] None  01/10/2019 11:00 AM MayGelene Mink, [facility] None    No orders of the defined types were placed in this encounter.     -------------------------------

## 2018-12-05 ENCOUNTER — Telehealth: Payer: Self-pay | Admitting: Psychiatry

## 2018-12-05 NOTE — Telephone Encounter (Signed)
Wife, Caryl Pina called. NO ROI or FYI on file. She called to ask if he could be switched from Propranolol to Guanfacin to see if that would help him with his anger issues.

## 2018-12-13 ENCOUNTER — Telehealth: Payer: Self-pay | Admitting: Mental Health

## 2018-12-13 ENCOUNTER — Telehealth: Payer: Self-pay | Admitting: Psychiatry

## 2018-12-13 DIAGNOSIS — F909 Attention-deficit hyperactivity disorder, unspecified type: Secondary | ICD-10-CM

## 2018-12-13 DIAGNOSIS — F419 Anxiety disorder, unspecified: Secondary | ICD-10-CM

## 2018-12-13 NOTE — Telephone Encounter (Signed)
Pts wife Caryl Pina called about trying to get in sooner to see Craig Fletcher for marital therapy. They asked for a Wed afternoon, but none are available. What would you like to do?

## 2018-12-13 NOTE — Telephone Encounter (Signed)
Patient called and said that he feels sleepy and has a headache all day. He feels exhausted. Today he didn't take the medicine and he didn't feel sleepy or have a headache. Can he change to a different medicine? Please give him a call at [phone]

## 2018-12-14 ENCOUNTER — Other Ambulatory Visit: Payer: Self-pay | Admitting: Psychiatry

## 2018-12-14 DIAGNOSIS — F419 Anxiety disorder, unspecified: Secondary | ICD-10-CM

## 2018-12-14 MED ORDER — GUANFACINE HCL 1 MG PO TABS: 1.0000 mg | ORAL_TABLET | Freq: Every day | ORAL | 0 refills | Status: DC

## 2018-12-14 NOTE — Telephone Encounter (Signed)
Return call back to patient and appreciative of the quick response back. Given side effects and advised to call back with any questions or concerns.

## 2018-12-20 ENCOUNTER — Other Ambulatory Visit: Payer: Self-pay

## 2018-12-20 ENCOUNTER — Encounter: Payer: Self-pay | Admitting: Psychiatry

## 2018-12-20 ENCOUNTER — Ambulatory Visit (INDEPENDENT_AMBULATORY_CARE_PROVIDER_SITE_OTHER): Payer: 59 | Admitting: Psychiatry

## 2018-12-20 DIAGNOSIS — F4323 Adjustment disorder with mixed anxiety and depressed mood: Secondary | ICD-10-CM | POA: Diagnosis not present

## 2018-12-20 NOTE — Progress Notes (Signed)
[facility] Counselor/Therapist Progress Note  Patient ID: Craig Fletcher, MRN: [id number],    Date: 12/20/2018  Time Spent: 50 minutes   Treatment Type: Individual Therapy  Reported Symptoms: stress, anxiety, irritable  Mental Status Exam:  Appearance:   Casual     Behavior:  Appropriate  Motor:  Normal  Speech/Language:   Clear and Coherent  Affect:  Appropriate  Mood:  anxious, irritable and stress  Thought process:  normal  Thought content:    WNL  Sensory/Perceptual disturbances:    WNL  Orientation:  oriented to person, place, time/date and situation  Attention:  Good  Concentration:  Good  Memory:  WNL  Fund of knowledge:   Good  Insight:    Good  Judgment:   Good  Impulse Control:  Good   Risk Assessment: Danger to Self:  No Self-injurious Behavior: No Danger to Others: No Duty to Warn:no Physical Aggression / Violence:No  Access to Firearms a concern: No  Gang Involvement:No   Subjective: Telehealth visit -- I connected with this patient by an approved telecommunication method (audio only), with his informed consent, and verifying identity and patient privacy.  I was located at my office and patient at his home.  As needed, we discussed the limitations, risks, and security and privacy concerns associated with telehealth service, including the availability and conditions which currently govern in-person appointments and the possibility that 3rd-party payment Craig Fletcher not be fully guaranteed and he Craig Fletcher be responsible for charges.  After he indicated understanding, we proceeded with the session.  Also discussed treatment planning, as needed, including ongoing verbal agreement with the plan, the opportunity to ask and answer all questions, his demonstrated understanding of instructions, and his readiness to call the office should symptoms worsen or he feels he is in a crisis state and needs more immediate and tangible assistance. The client was at home today helping his  son with virtual school.  He states that being the disciplinarian and teacher makes the relationship with his 71-year-old son more difficult.  Since the pandemic has been going on, his son has no friends to play with.  The client and his wife are his sons only interaction except for some extended family.  This makes his son needy and focused excessively on the client. His main complaint today was, "I cannot get things done."  He states that his stepdad just got diagnosed with lymphoma and early onset Alzheimer's.  His parents divorced when he was 37 or 68 years old.  "My stepdad was my dad."  His biological father died a few years ago due to alcoholism.  This has been an added layer of stress for him. The client and his wife have been seeing Craig Fletcher, [facility] for marriage counseling.  The client wants to be seen individually to work on some of his own issues.  He and his wife have conflict due to communication issues.  They have been married 8-1/2 years.  His wife has obsessive-compulsive disorder and repeats things over and over to him.  If he does not repeat them the same amount of times then he is, "not listening".  They have a lot of stress in their marriage.  She works full-time as a Engineer, civil (consulting) in a [employer], trade union.  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  He also has a part-time job with the [employer].  He also is in school working on his 2-year degree in the Science writer.  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  With all this going on he does not feel that his wife supports him.  It is hard for him to get things completed at home such as housework and outside chores. The client identified the following issues he would like to address; 1) how can I interpret things more positively? 2) the pandemic is taking control of my life, how can I regain that? 3) I need to decrease my  stress because I am stuck at home all the time. 4) I need parenting strategies to deal with my son better. The client agrees to meet face-to-face at her next session.  I have not discussed interventions such as EMDR with the client yet.  In the meantime he will exercise, increase his self-care, try to set boundaries with his wife and being more assertive.  Interventions: Assertiveness/Communication, Motivational Interviewing, Solution-Oriented/Positive Psychology and Insight-Oriented  Diagnosis:   ICD-10-CM   1. Adjustment disorder with mixed anxiety and depressed mood  F43.23     Plan: Exercise, self-care, boundaries, assertiveness.  Use EMDR to decrease stress.  Add appropriate skills that he needs such as boundaries and assertiveness.  Do problem solving.  Craig Fletcher, [facility]

## 2018-12-26 ENCOUNTER — Other Ambulatory Visit: Payer: Self-pay

## 2018-12-26 ENCOUNTER — Ambulatory Visit (INDEPENDENT_AMBULATORY_CARE_PROVIDER_SITE_OTHER): Payer: 59 | Admitting: Mental Health

## 2018-12-26 DIAGNOSIS — F4323 Adjustment disorder with mixed anxiety and depressed mood: Secondary | ICD-10-CM

## 2018-12-26 DIAGNOSIS — F909 Attention-deficit hyperactivity disorder, unspecified type: Secondary | ICD-10-CM | POA: Diagnosis not present

## 2018-12-26 NOTE — Progress Notes (Signed)
[facility] Counselor/therapist psychotherapy note  Name: Craig Fletcher Date: 12/26/18  MRN: [id number] DOB: 11/28/1985 PCP: Emi Belfast, FNP  Time spent:  58 minutes   Treatment: Family therapy  Mental Status Exam:   Appearance:   casual  Behavior:  WNL  Motor:  WNL  Speech/Language:   Clear and Coherent  Affect:  Full range  Mood:  Sad, pleasant, anxious  Thought process:  normal  Thought content:    WNL  Sensory/Perceptual disturbances:    WNL  Orientation:  x4  Attention:  Good  Concentration:  Good  Memory:  WNL  Fund of knowledge:   Good  Insight:    Good  Judgment:   Good  Impulse Control:  Good   Reported Symptoms:  Anxious, irritability, distractible, sadness, distractible, some problems w/ focus  Risk Assessment: Danger to Self:  No Self-injurious Behavior: No Danger to Others: No Duty to Warn:no Physical Aggression / Violence:No  Access to Firearms a concern: No  Gang Involvement:No  Patient / guardian was educated about steps to take if suicide or homicide risk level increases between visits: yes While future psychiatric events cannot be accurately predicted, the patient does not currently require acute inpatient psychiatric care and does not currently meet [facility] involuntary commitment criteria.    Subjective:    They have tried to engage in more activities together, cooking together, meal planning over the past week. They had a good dinner tonight prior to the session.   This has eliminated arguments as the meals have been pre-prepared through the food service they have used. She was pleased he was okay with the meal planning. He was pleased she took the time to cook. He felt like he was the only one cooking. This change have given them time, at least 30 minutes to talk. They still need to work on their communication.  She stated she is trying not to be "moody" in the morning, not "jump to conclusions" as discussed last session but still  feels she has room to improve. She complimented him in session, stating he has been communicating more calmly w/ their son. He stated she has been a lot more patient, understanding, cleaning more w/ him. They have also reduced arguing in front of their child.  He is trying to not walk away while discussing issues.  He likes how she is asking him to instead of being demanding. She stated she realizes how he is trying to make changes.  She still wants him to not wait until the last minute do things, like to schedule this appt and have child set up sooner. He admits this can be an issue for him.  They need to work on their timing of when to ask questions or make requests.  She wants him to not talk under his breath when upset. This makes her feel belittled, reminds her of father as he had this behavior and is very hurtful. She wants him to be walk away if needed when upset.  He stated most of their days together are when he is tired, she gets home and he has been w/ their son, working on tasks, helping their son w/ school.  Interventions: Communication strategies, supportive approaches, problem solving approaches  Diagnoses:  AD/HD by hx  Individualized Plan of Care:   1. Patient/family to engage in family psychotherapy 2-4 times a month or as needed.  2. Patient/family is to use communication, coping skills to improve family relationships.    3.  Patient will identify when he needs time to remove himself from a situation in which he may make comments that are hurtful.  Wife and patient will focus on active listening to further understand her husband's intentions as opposed to reacting impulsively.  4. Patient/family to contact this office, go to the local ED or call 911 if a crisis or emergency develops between visits.     Anson Oregon, [facility]

## 2018-12-27 ENCOUNTER — Ambulatory Visit: Payer: 59 | Admitting: Mental Health

## 2018-12-29 ENCOUNTER — Ambulatory Visit: Payer: 59 | Admitting: Psychiatry

## 2019-01-02 ENCOUNTER — Other Ambulatory Visit: Payer: Self-pay | Admitting: Psychiatry

## 2019-01-02 DIAGNOSIS — F419 Anxiety disorder, unspecified: Secondary | ICD-10-CM

## 2019-01-02 NOTE — Telephone Encounter (Signed)
Clarification, is patient taking 2 tablets bid?

## 2019-01-05 ENCOUNTER — Other Ambulatory Visit: Payer: Self-pay

## 2019-01-05 ENCOUNTER — Telehealth: Payer: Self-pay | Admitting: Psychiatry

## 2019-01-05 ENCOUNTER — Ambulatory Visit (INDEPENDENT_AMBULATORY_CARE_PROVIDER_SITE_OTHER): Payer: 59 | Admitting: Psychiatry

## 2019-01-05 ENCOUNTER — Encounter: Payer: Self-pay | Admitting: Psychiatry

## 2019-01-05 DIAGNOSIS — F4323 Adjustment disorder with mixed anxiety and depressed mood: Secondary | ICD-10-CM

## 2019-01-05 NOTE — Progress Notes (Signed)
[facility] Counselor/Therapist Progress Note  Patient ID: OSHEN WLODARCZYK, MRN: [id number],    Date: 01/05/2019  Time Spent: 50 minutes   Treatment Type: Individual Therapy  Reported Symptoms: sad, depressed, angry  Mental Status Exam:  Appearance:   Casual     Behavior:  Appropriate  Motor:  Normal  Speech/Language:   Clear and Coherent  Affect:  Appropriate  Mood:  angry, anxious and sad  Thought process:  normal  Thought content:    WNL  Sensory/Perceptual disturbances:    WNL  Orientation:  oriented to person, place, time/date and situation  Attention:  Good  Concentration:  Good  Memory:  WNL  Fund of knowledge:   Good  Insight:    Good  Judgment:   Good  Impulse Control:  Good   Risk Assessment: Danger to Self:  No Self-injurious Behavior: No Danger to Others: No Duty to Warn:no Physical Aggression / Violence:No  Access to Firearms a concern: No  Gang Involvement:No   Subjective: The client reports that he and his wife separated as of last night.  Things escalated between them and she became so angry that she left with their son and went to her parents.  "I am at the house." I explained the EMDR process to the client.  He understood and agreed to treatment.  We focused on the anxiety that he feels at this current separation with his wife.  I used eye-movement as the client thinks about his wife, Caryl Pina.  His negative cognition is, "it is going to be bad.  I am powerless."  He feels anxiety in his chest and shoulders.  His subjective units of distress is a 6+ as the client processed he felt powerless, angry and sad.  "I want to leave but not."  The client states that this has been like this since about a year after their son was born.  He feels that nothing he does is right.  Anything he says or does she takes issue with.  His wife has OCD and Kristin Lamagna be in a depressive episode as well. As the client continued to process he experienced a lot of sadness as well as  anger.  He is tired of things being so difficult.  The client stated in the past when they would go to church together before the pandemic that things seem to be on a more even keel.  I used the bilateral stimulation hand paddles with the client.  I had him visualize being in the kitchen with his wife coming in.  Then I asked the client to invite Jesus into that picture.  He became very tearful and sad and he emoted quite a bit.  He realized that he and his wife needed to get their family back on good spiritual footing.  He felt comforted by Jesus and had more acceptance with his circumstances.  I explained to him that he would have to practice more mood independent behavior.  The only behavior that he can control is his own.  He agreed.  His positive cognition at the end of the session was, "my heart is in the right place."  His subjective units of distress was less than 3.  Interventions: Assertiveness/Communication, Motivational Interviewing, Solution-Oriented/Positive Psychology, [facility] Desensitization and Reprocessing (EMDR) and Insight-Oriented  Diagnosis:   ICD-10-CM   1. Adjustment disorder with mixed anxiety and depressed mood  F43.23     Plan: Mood independent behavior, self-care, exercise, boundaries, assertiveness, positive self talk.  Derwin Reddy, [facility]

## 2019-01-05 NOTE — Telephone Encounter (Signed)
Pt called in asking if there was an opening today. He would like to talk with you if at all possible.

## 2019-01-10 ENCOUNTER — Ambulatory Visit (INDEPENDENT_AMBULATORY_CARE_PROVIDER_SITE_OTHER): Payer: 59 | Admitting: Mental Health

## 2019-01-10 ENCOUNTER — Ambulatory Visit: Payer: 59 | Admitting: Psychiatry

## 2019-01-10 DIAGNOSIS — F4323 Adjustment disorder with mixed anxiety and depressed mood: Secondary | ICD-10-CM

## 2019-01-10 DIAGNOSIS — F909 Attention-deficit hyperactivity disorder, unspecified type: Secondary | ICD-10-CM

## 2019-01-10 NOTE — Progress Notes (Signed)
[facility] Counselor/therapist psychotherapy note  Name: Craig Fletcher Date: 01/10/19  MRN: [id number] DOB: 11-08-1985 PCP: Elby Beck, FNP  Time spent:  58 minutes   Treatment: Family therapy  Mental Status Exam:   Appearance:   casual  Behavior:  WNL  Motor:  WNL  Speech/Language:   Clear and Coherent  Affect:  Full range  Mood:  Sad, pleasant, anxious  Thought process:  normal  Thought content:    WNL  Sensory/Perceptual disturbances:    WNL  Orientation:  x4  Attention:  Good  Concentration:  Good  Memory:  WNL  Fund of knowledge:   Good  Insight:    Good  Judgment:   Good  Impulse Control:  Good   Reported Symptoms:  Anxious, irritability, distractible, sadness, distractible, some problems w/ focus  Risk Assessment: Danger to Self:  No Self-injurious Behavior: No Danger to Others: No Duty to Warn:no Physical Aggression / Violence:No  Access to Firearms a concern: No  Gang Involvement:No  Patient / guardian was educated about steps to take if suicide or homicide risk level increases between visits: yes While future psychiatric events cannot be accurately predicted, the patient does not currently require acute inpatient psychiatric care and does not currently meet [facility] involuntary commitment criteria.    Subjective:    Patient and wife engage today's session via telehealth.  They shared continued marital stress, most notably separating for about 2 days due to a recent argument.  Thomas spent to allow them to fully process the event.  Patient identified the need for his to discontinue drinking alcohol.  He stated that if it is going to affect his marriage to a point to where it might end, this is another motivation for him to stop completely.  Assisted them and continuing to explore needs in the relationship, assisting with communication with the refocus on intentions versus reacting in situations that ultimately lead them to having arguments.  Both  were receptive and were able to practice in session some steps toward more effective communication, being able to identify one another's needs and provide emotional support as well.  Encouraged him to continue between sessions with a focus on making quality time a priority.  They plan to have a date night and also spend the night while the grandparents watch their child.  Interventions: Communication strategies, supportive approaches, problem solving approaches  Diagnoses:  AD/HD by hx  Individualized Plan of Care:   1. Patient/family to engage in family psychotherapy 2-4 times a month or as needed.  2. Patient/family is to use communication, coping skills to improve family relationships.    3.  Patient will identify when he needs time to remove himself from a situation in which he may make comments that are hurtful.  Wife and patient will focus on active listening to further understand her husband's intentions as opposed to reacting impulsively.  4. Patient/family to contact this office, go to the local ED or call 911 if a crisis or emergency develops between visits.  Progress: Progressing   Anson Oregon, [facility]

## 2019-01-18 ENCOUNTER — Other Ambulatory Visit: Payer: Self-pay

## 2019-01-18 ENCOUNTER — Ambulatory Visit: Payer: 59 | Admitting: Psychiatry

## 2019-01-24 ENCOUNTER — Ambulatory Visit: Payer: 59 | Admitting: Mental Health

## 2019-01-26 ENCOUNTER — Ambulatory Visit: Payer: 59 | Admitting: Psychiatry

## 2019-01-31 ENCOUNTER — Other Ambulatory Visit: Payer: Self-pay

## 2019-01-31 ENCOUNTER — Ambulatory Visit (INDEPENDENT_AMBULATORY_CARE_PROVIDER_SITE_OTHER): Payer: 59 | Admitting: Psychiatry

## 2019-01-31 ENCOUNTER — Encounter: Payer: Self-pay | Admitting: Psychiatry

## 2019-01-31 DIAGNOSIS — F4323 Adjustment disorder with mixed anxiety and depressed mood: Secondary | ICD-10-CM | POA: Diagnosis not present

## 2019-01-31 NOTE — Progress Notes (Signed)
      [facility] Counselor/Therapist Progress Note  Patient ID: Craig Fletcher, MRN: [id number],    Date: 01/31/2019  Time Spent: 50 minutes   Treatment Type: Individual Therapy  Reported Symptoms: anxiety, depression  Mental Status Exam:  Appearance:   Casual     Behavior:  Appropriate  Motor:  Normal  Speech/Language:   Clear and Coherent  Affect:  Appropriate  Mood:  anxious and sad  Thought process:  normal  Thought content:    WNL  Sensory/Perceptual disturbances:    WNL  Orientation:  oriented to person, place, time/date and situation  Attention:  Good  Concentration:  Good  Memory:  WNL  Fund of knowledge:   Good  Insight:    Good  Judgment:   Good  Impulse Control:  Good   Risk Assessment: Danger to Self:  No Self-injurious Behavior: No Danger to Others: No Duty to Warn:no Physical Aggression / Violence:No  Access to Firearms a concern: No  Gang Involvement:No   Subjective: "I am in a weird place.  My son has been hard to deal with."  The client states he has been feeling some irritation and anxiety related to caring for his son.  Today we used eye-movement focusing on being home with his son.  His negative cognition is, "there is no end in sight."  He feels irritation and resentfulness in his chest.  His subjective units of distress is an 8.  As the client processed he stated he was resentful about the past stuff with his wife and how she has been.  He does a lot of the care for their son.  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  His son has a terrible attitude with school.  "He is hard to control."  The client states that he and his wife are inconsistent in the way that they parent.  Inevitably they Craig Fletcher give into him which increases his son's desire to push the boundaries.  We talked about being more consistent and enforcing the boundaries mercilessly.  I suggested that he review a book called,  "have a new child by Friday".  As he continued to process, his subjective units of distress began to drop.  I asked the client what he has control over?  He said he has control over himself.  I then challenged the client to not let his son define and impact his mood.  He agreed.  We discussed the use of mood independent behavior.  I asked the client to consider having more radical acceptance that his son will act out.  That way when he does it is no surprise.  This made sense to the client.  He verbalized, "I can control my actions."  As he did this his subjective units of distress dropped to about a 1.  His positive cognition at the end of the session was, "I can be more consistent."  Interventions: Assertiveness/Communication, Motivational Interviewing, Solution-Oriented/Positive Psychology, Psycho-education/Bibliotherapy, Eye Movement Desensitization and Reprocessing (EMDR) and Insight-Oriented     Diagnosis:   ICD-10-CM   1. Adjustment disorder with mixed anxiety and depressed mood  F43.23     Plan: Self-care, cardio, mood independent behavior, radical acceptance, review book, "have a new child by Friday", assertiveness, boundaries.  Craig Fletcher, [facility]

## 2019-02-06 ENCOUNTER — Other Ambulatory Visit: Payer: Self-pay | Admitting: Psychiatry

## 2019-02-06 DIAGNOSIS — F909 Attention-deficit hyperactivity disorder, unspecified type: Secondary | ICD-10-CM

## 2019-02-06 DIAGNOSIS — F419 Anxiety disorder, unspecified: Secondary | ICD-10-CM

## 2019-02-07 ENCOUNTER — Ambulatory Visit (INDEPENDENT_AMBULATORY_CARE_PROVIDER_SITE_OTHER)
Admission: RE | Admit: 2019-02-07 | Discharge: 2019-02-07 | Disposition: A | Discharge status: Home / Self Care | Payer: 59 | Source: Ambulatory Visit | Attending: Family Medicine | Admitting: Family Medicine

## 2019-02-07 ENCOUNTER — Ambulatory Visit: Payer: 59 | Admitting: Family Medicine

## 2019-02-07 ENCOUNTER — Encounter: Payer: Self-pay | Admitting: Family Medicine

## 2019-02-07 ENCOUNTER — Ambulatory Visit (INDEPENDENT_AMBULATORY_CARE_PROVIDER_SITE_OTHER): Payer: Self-pay | Admitting: Mental Health

## 2019-02-07 ENCOUNTER — Other Ambulatory Visit: Payer: Self-pay

## 2019-02-07 VITALS — BP 110/80 | HR 66 | Temp 98.3°F | Ht 72.0 in | Wt 228.5 lb

## 2019-02-07 DIAGNOSIS — R109 Unspecified abdominal pain: Secondary | ICD-10-CM | POA: Diagnosis not present

## 2019-02-07 DIAGNOSIS — F4323 Adjustment disorder with mixed anxiety and depressed mood: Secondary | ICD-10-CM

## 2019-02-07 DIAGNOSIS — F909 Attention-deficit hyperactivity disorder, unspecified type: Secondary | ICD-10-CM

## 2019-02-07 LAB — POC URINALSYSI DIPSTICK (AUTOMATED)
Bilirubin, UA: NEGATIVE
Blood, UA: NEGATIVE
Glucose, UA: NEGATIVE
Ketones, UA: NEGATIVE
Leukocytes, UA: NEGATIVE
Nitrite, UA: NEGATIVE
Protein, UA: NEGATIVE
Spec Grav, UA: >=1.03 — AB (ref 1.010–1.025)
Urobilinogen, UA: 0.2 E.U./dL
pH, UA: 6 (ref 5.0–8.0)

## 2019-02-07 MED ORDER — IBUPROFEN 800 MG PO TABS: 800.0000 mg | ORAL_TABLET | Freq: Three times a day (TID) | ORAL | 0 refills | Status: AC | PRN

## 2019-02-07 NOTE — Progress Notes (Signed)
Subjective:    Patient ID: Craig Fletcher, male    DOB: 22-Dec-1985, 33 y.o.   MRN: [id number]  HPI Chief Complaint  Patient presents with  . Back Pain    Radiates down to groin area   This is a 33 yo male who presents today with back pain x 2 days. Woke up at fire department, ran a call, noticed pain. Not sure if he woke with it. No numbness, tingling, some pain along right side that radiates into right side of groin. Pain is positional at times. ? Some improvement with getting up and walking around. Took advil 400 mg without relief. Slept ok last night. No dysuria, no hematuria. No change in bowels or bladder.   History reviewed. No pertinent past medical history. Past Surgical History:  Procedure Laterality Date  . CHEST WALL RECONSTRUCTION     Family History  Problem Relation Age of Onset  . Depression Mother   . Alcohol abuse Father    Social History   Tobacco Use  . Smoking status: Never Smoker  . Smokeless tobacco: Never Used  Substance Use Topics  . Alcohol use: Yes    Comment: rare/occ  . Drug use: No      Review of Systems Per HPI    Objective:   Physical Exam Vitals reviewed.  Constitutional:      General: He is not in acute distress.    Appearance: Normal appearance. He is obese. He is not ill-appearing, toxic-appearing or diaphoretic.  HENT:     Head: Normocephalic and atraumatic.     Right Ear: External ear normal.     Left Ear: External ear normal.  Cardiovascular:     Rate and Rhythm: Normal rate.  Pulmonary:     Effort: Pulmonary effort is normal.  Abdominal:     Tenderness: There is no right CVA tenderness, left CVA tenderness, guarding or rebound.  Musculoskeletal:     Cervical back: Normal.     Thoracic back: Normal.     Lumbar back: No swelling, deformity or bony tenderness. Normal range of motion. Negative right straight leg raise test and negative left straight leg raise test.  Neurological:     Mental Status: He is alert.        BP 110/80   Pulse 66   Temp 98.3 F (36.8 C) (Temporal)   Ht 6' (1.829 m)   Wt 228 lb 8 oz (103.6 kg)   SpO2 95%   BMI 30.99 kg/m  Wt Readings from Last 3 Encounters:  02/07/19 228 lb 8 oz (103.6 kg)  12/28/17 213 lb (96.6 kg)  12/16/17 213 lb 12 oz (97 kg)   Results for orders placed or performed in visit on 02/07/19  POCT Urinalysis Dipstick (Automated)  Result Value Ref Range   Color, UA Yellow    Clarity, UA Clear    Glucose, UA Negative Negative   Bilirubin, UA Negative    Ketones, UA Negative    Spec Grav, UA >=1.030 (A) 1.010 - 1.025   Blood, UA Negative    pH, UA 6.0 5.0 - 8.0   Protein, UA Negative Negative   Urobilinogen, UA 0.2 0.2 or 1.0 E.U./dL   Nitrite, UA Negative    Leukocytes, UA Negative Negative       Assessment & Plan:  1. Right flank pain - unclear etiology, will check kub, urine - NSAIDs, heat, gentle ROM - DG Abd 1 View; Future - POCT Urinalysis Dipstick (Automated) - ibuprofen (ADVIL)  800 MG tablet; Take 1 tablet (800 mg total) by mouth every 8 (eight) hours as needed.  Dispense: 30 tablet; Refill: 0 - UC/ER precautions reviewed  This visit occurred during the SARS-CoV-2 public health emergency.  Safety protocols were in place, including screening questions prior to the visit, additional usage of staff PPE, and extensive cleaning of exam room while observing appropriate contact time as indicated for disinfecting solutions.    Olean Ree, FNP-BC  [facility], [facility]  02/07/2019 10:14 PM

## 2019-02-07 NOTE — Progress Notes (Signed)
[facility] Counselor/therapist psychotherapy note  Name: Craig Fletcher Date: 02/07/19 MRN:/ [id number] DOB: Jul 06, 1985 PCP: Elby Beck, FNP  Time spent:  58 minutes   Treatment: Family therapy  Mental Status Exam:   Appearance:   casual  Behavior:  WNL  /Motor:  WNL  Speech/Language:   Clear and Coherent  Affect:  Full range  Mood:  depressed  Thought process:  normal  Thought content:    WNL  Sensory/Perceptual disturbances:    WNL  Orientation:  x4  Attention:  Good  Concentration:  Good  Memory:  WNL  Fund of knowledge:   Good  Insight:    Good  Judgment:   Good  Impulse Control:  Good   Reported Symptoms:  Anxious, irritability, distractible, sadness, distractible, some problems w/ focus  Risk Assessment: Danger to Self:  No Self-injurious Behavior: No Danger to Others: No Duty to Warn:no Physical Aggression / Violence:No  Access to Firearms a concern: No  Gang Involvement:No  Patient / guardian was educated about steps to take if suicide or homicide risk level increases between visits: yes While future psychiatric events cannot be accurately predicted, the patient does not currently require acute inpatient psychiatric care and does not currently meet [facility] involuntary commitment criteria.    Subjective:    He stated they continue to improve their communication. They stated their son has been more independent, not needing his parents to be interacting w/ him as often. She stated Pt may talk under his breath when upset then, walks away. They followed up on a date together. Today, they are alone as son is w/ grandparents. They are cooking together more. She stated he is listening better but last night, he tries to do things his way versus how she asks him to. Example last night was her asking for help from Pt while making a gingerbread house, stated he tried to do it all himself. They continue to lack intimacy but plan to work on this, to make time. She  stated Pt was watching a football game recently arguing w/ their son. Their son was tired that day. They are more on the same page w/ discipline. He stated his alcohol use has continued to be less.  He plans to follow through with discussing openly if he wants to have a drink with her in advance as this was identified as a need.  Continue to work on communication was a focus.  Needing eye contact was identified, feeling understood, heard and respected with plan to follow through between sessions was expressed.  Interventions: Communication strategies, supportive approaches, problem solving approaches  Diagnoses:  Adjustment disorder: AD/HD   Individualized Plan of Care:   1. Patient/family to engage in family psychotherapy 2-4 times a month or as needed.  2. Patient/family is to use communication, coping skills to improve family relationships.    3.  Patient will identify when he needs time to remove himself from a situation in which he may make comments that are hurtful.  Wife and patient will focus on active listening to further understand her husband's intentions as opposed to reacting impulsively.  4. Patient/family to contact this office, go to the local ED or call 911 if a crisis or emergency develops between visits.  Progress: Progressing   Anson [place], [facility]

## 2019-02-07 NOTE — Patient Instructions (Signed)
Good to see you today  Continue to drink lots of liquids  I will notify you of urine test and xray

## 2019-02-08 ENCOUNTER — Encounter: Payer: Self-pay | Admitting: Family Medicine

## 2019-02-08 ENCOUNTER — Telehealth: Payer: 59 | Admitting: Nurse Practitioner

## 2019-02-08 DIAGNOSIS — M545 Low back pain, unspecified: Secondary | ICD-10-CM

## 2019-02-08 MED ORDER — NAPROXEN 500 MG PO TABS: 500.0000 mg | ORAL_TABLET | Freq: Two times a day (BID) | ORAL | 0 refills | Status: DC

## 2019-02-08 MED ORDER — CYCLOBENZAPRINE HCL 10 MG PO TABS: 10.0000 mg | ORAL_TABLET | Freq: Three times a day (TID) | ORAL | 0 refills | Status: DC | PRN

## 2019-02-08 NOTE — Progress Notes (Signed)

## 2019-02-12 NOTE — Telephone Encounter (Signed)
-  Craig Fletcher left v/m on back line for triage [phone]. on 02/08/19 at 3:40. [place] closed on 02/08/19 at 1:00pm. I left v/m per [facility] apologizing for late cb but [facility] closed on 02/08/19 at 1 PM. Appears pt and D Gessner FNP my chart messaging since initial call on 02/08/19 and if anything further needed please cb to [phone]. fyi to Glenda Chroman FNP.

## 2019-02-12 NOTE — Telephone Encounter (Signed)
Noted  

## 2019-02-15 ENCOUNTER — Ambulatory Visit
Admission: EM | Admit: 2019-02-15 | Discharge: 2019-02-15 | Disposition: A | Discharge status: Home / Self Care | Payer: 59 | Attending: Emergency Medicine | Admitting: Emergency Medicine

## 2019-02-15 ENCOUNTER — Other Ambulatory Visit: Payer: Self-pay

## 2019-02-15 ENCOUNTER — Encounter: Payer: Self-pay | Admitting: Emergency Medicine

## 2019-02-15 DIAGNOSIS — Z0189 Encounter for other specified special examinations: Secondary | ICD-10-CM | POA: Diagnosis not present

## 2019-02-15 DIAGNOSIS — Z20828 Contact with and (suspected) exposure to other viral communicable diseases: Secondary | ICD-10-CM

## 2019-02-15 NOTE — ED Provider Notes (Signed)
[facility]    CSN: [id number] Arrival date & time: 02/15/19  1522      History   Chief Complaint Chief Complaint  Patient presents with  . covid test    HPI Craig MerinoJoseph F Fletcher is a 33 y.o. male.   Patient presents with request for a COVID test.  He is asymptomatic but his son has had a fever today.  Patient denies fever, chills, sore throat, cough, shortness of breath, vomiting, diarrhea, rash, or other symptoms.  No treatments attempted at home.    The history is provided by the patient.    History reviewed. No pertinent past medical history.  Patient Active Problem List   Diagnosis Date Noted  . Acute sinusitis 02/13/2016  . Adjustment disorder with mixed anxiety and depressed mood 06/09/2015  . Lymphadenitis 04/16/2015  . Fatigue 09/30/2014  . ADD (attention deficit disorder) 08/02/2012    Past Surgical History:  Procedure Laterality Date  . CHEST WALL RECONSTRUCTION         Home Medications    Prior to Admission medications   Medication Sig Start Date End Date Taking? Authorizing Provider  busPIRone (BUSPAR) 30 MG tablet Take 1 tablet (30 mg total) by mouth 2 (two) times daily. 01/02/19   Corie Chiquitoarter, Jessica, PMHNP  cyclobenzaprine (FLEXERIL) 10 MG tablet Take 1 tablet (10 mg total) by mouth 3 (three) times daily as needed for muscle spasms. 02/08/19   Daphine DeutscherMartin, Mary-Margaret, FNP  guanFACINE (TENEX) 1 MG tablet TAKE 1 TABLET (1 MG TOTAL) BY MOUTH AT BEDTIME. 02/06/19 03/08/19  Corie Chiquitoarter, Jessica, PMHNP  ibuprofen (ADVIL) 800 MG tablet Take 1 tablet (800 mg total) by mouth every 8 (eight) hours as needed. 02/07/19   Emi BelfastGessner, Deborah B, FNP  naproxen (NAPROSYN) 500 MG tablet Take 1 tablet (500 mg total) by mouth 2 (two) times daily with a meal. 02/08/19   Bennie PieriniMartin, Mary-Margaret, FNP    Family History Family History  Problem Relation Age of Onset  . Depression Mother   . Alcohol abuse Father     Social History Social History   Tobacco Use  . Smoking  status: Never Smoker  . Smokeless tobacco: Never Used  Substance Use Topics  . Alcohol use: Yes    Comment: rare/occ  . Drug use: No     Allergies   Patient has no known allergies.   Review of Systems Review of Systems  Constitutional: Negative for chills and fever.  HENT: Negative for congestion, ear pain, rhinorrhea and sore throat.   Eyes: Negative for pain and visual disturbance.  Respiratory: Negative for cough and shortness of breath.   Cardiovascular: Negative for chest pain and palpitations.  Gastrointestinal: Negative for abdominal pain, diarrhea, nausea and vomiting.  Genitourinary: Negative for dysuria and hematuria.  Musculoskeletal: Negative for arthralgias and back pain.  Skin: Negative for color change and rash.  Neurological: Negative for seizures and syncope.  All other systems reviewed and are negative.    Physical Exam Triage Vital Signs ED Triage Vitals  Enc Vitals Group     BP      Pulse      Resp      Temp      Temp src      SpO2      Weight      Height      Head Circumference      Peak Flow      Pain Score      Pain Loc  Pain Edu?      Excl. in [place]?    No data found.  Updated Vital Signs BP 122/84 (BP Location: Left Arm)   Pulse 79   Temp 98 F (36.7 C) (Oral)   Resp 18   Wt 230 lb (104.3 kg)   SpO2 95%   BMI 31.19 kg/m   Visual Acuity Right Eye Distance:   Left Eye Distance:   Bilateral Distance:    Right Eye Near:   Left Eye Near:    Bilateral Near:     Physical Exam Vitals and nursing note reviewed.  Constitutional:      General: He is not in acute distress.    Appearance: He is well-developed. He is not ill-appearing.  HENT:     Head: Normocephalic and atraumatic.     Right Ear: Tympanic membrane normal.     Left Ear: Tympanic membrane normal.     Nose: Nose normal.     Mouth/Throat:     Mouth: Mucous membranes are moist.     Pharynx: Oropharynx is clear.  Eyes:     Conjunctiva/sclera: Conjunctivae  normal.  Cardiovascular:     Rate and Rhythm: Normal rate and regular rhythm.     Heart sounds: No murmur.  Pulmonary:     Effort: Pulmonary effort is normal. No respiratory distress.     Breath sounds: Normal breath sounds.  Abdominal:     General: Bowel sounds are normal.     Palpations: Abdomen is soft.     Tenderness: There is no abdominal tenderness. There is no guarding or rebound.  Musculoskeletal:     Cervical back: Neck supple.  Skin:    General: Skin is warm and dry.     Findings: No rash.  Neurological:     General: No focal deficit present.     Mental Status: He is alert and oriented to person, place, and time.  Psychiatric:        Mood and Affect: Mood normal.        Behavior: Behavior normal.      UC Treatments / Results  Labs (all labs ordered are listed, but only abnormal results are displayed) Labs Reviewed  NOVEL CORONAVIRUS, NAA    EKG   Radiology No results found.  Procedures Procedures (including critical care time)  Medications Ordered in UC Medications - No data to display  Initial Impression / Assessment and Plan / UC Course  I have reviewed the triage vital signs and the nursing notes.  Pertinent labs & imaging results that were available during my care of the patient were reviewed by me and considered in my medical decision making (see chart for details).    Patient request for COVID test.  COVID test performed here.  Instructed patient to self quarantine until the test result is back.  Instructed patient to go to the emergency department if he develops high fever, shortness of breath, severe diarrhea, or other concerning symptoms.  Patient agrees with plan of care.     Final Clinical Impressions(s) / UC Diagnoses   Final diagnoses:  Patient request for diagnostic testing     Discharge Instructions     Your COVID test is pending.  You should self quarantine until your test result is back and is negative.    Go to the  emergency department if you develop high fever, shortness of breath, severe diarrhea, or other concerning symptoms.       ED Prescriptions    None  PDMP not reviewed this encounter.   Mickie Bail, NP 02/15/19 [phone]

## 2019-02-15 NOTE — Discharge Instructions (Addendum)
Your COVID test is pending.  You should self quarantine until your test result is back and is negative.   ° °Go to the emergency department if you develop high fever, shortness of breath, severe diarrhea, or other concerning symptoms.   ° °

## 2019-02-17 LAB — NOVEL CORONAVIRUS, NAA: SARS-CoV-2, NAA: DETECTED — AB

## 2019-02-19 ENCOUNTER — Telehealth (HOSPITAL_COMMUNITY): Payer: Self-pay | Admitting: Emergency Medicine

## 2019-02-19 NOTE — Telephone Encounter (Signed)

## 2019-02-20 ENCOUNTER — Other Ambulatory Visit: Payer: 59

## 2019-02-21 ENCOUNTER — Telehealth: Payer: Self-pay | Admitting: Psychiatry

## 2019-02-21 NOTE — Telephone Encounter (Signed)
Pt left v-mail asking to increase TENEX from 1 mg to 2 mg. Call [phone]

## 2019-02-21 NOTE — Telephone Encounter (Signed)
Noted thank you

## 2019-02-21 NOTE — Telephone Encounter (Signed)
Pt scheduled for 02/22/19

## 2019-02-22 ENCOUNTER — Ambulatory Visit (INDEPENDENT_AMBULATORY_CARE_PROVIDER_SITE_OTHER): Payer: 59 | Admitting: Psychiatry

## 2019-02-22 ENCOUNTER — Encounter: Payer: Self-pay | Admitting: Psychiatry

## 2019-02-22 DIAGNOSIS — F909 Attention-deficit hyperactivity disorder, unspecified type: Secondary | ICD-10-CM | POA: Diagnosis not present

## 2019-02-22 DIAGNOSIS — F419 Anxiety disorder, unspecified: Secondary | ICD-10-CM | POA: Diagnosis not present

## 2019-02-22 MED ORDER — BUSPIRONE HCL 30 MG PO TABS: 30.0000 mg | ORAL_TABLET | Freq: Two times a day (BID) | ORAL | 1 refills | Status: DC

## 2019-02-22 MED ORDER — GUANFACINE HCL 2 MG PO TABS: 2.0000 mg | ORAL_TABLET | Freq: Every day | ORAL | 1 refills | Status: DC

## 2019-02-22 NOTE — Progress Notes (Signed)
Craig Fletcher [id number] 1985-11-04 34 y.o.  Virtual Visit via Telephone Note  I connected with pt on 02/22/19 at  4:00 PM EST by telephone and verified that I am speaking with the correct person using two identifiers.   I discussed the limitations, risks, security and privacy concerns of performing an evaluation and management service by telephone and the availability of in person appointments. I also discussed with the patient that there may be a patient responsible charge related to this service. The patient expressed understanding and agreed to proceed.   I discussed the assessment and treatment plan with the patient. The patient was provided an opportunity to ask questions and all were answered. The patient agreed with the plan and demonstrated an understanding of the instructions.   The patient was advised to call back or seek an in-person evaluation if the symptoms worsen or if the condition fails to improve as anticipated.  I provided 25 minutes of non-face-to-face time during this encounter.  The patient was located at home.  The provider was located at [place].   Craig Fletcher, PMHNP   Subjective:   Patient ID:  Craig Fletcher is a 34 y.o. (DOB 1986-01-29) male.  Chief Complaint:  Chief Complaint  Patient presents with  . Anxiety  . ADD  . Other    Frustration/irritability    HPI Craig Fletcher presents for follow-up of anxiety. Pt reports that both he and his son have tested positive for COVID and are currently quarantined. Pt reports that he is now having difficulty with isolation. Son has been having difficulty with school work, ADD, and ODD. He reports that there are days at work he feels that medication is helping and other days he does not notice a benefit. He notices some anxiety and irritability, particularly this week with being quarantined. He denies any improvement in anxiety or irritability with quanfacine. Reports frequent worry, particularly  about his son. He reports some depression at times with lower motivation. He reports energy is ok. Sleep is adequate and has been sleeping longer with COVID. Reports that he is sleeping deeper since starting Guanfacine.  Appetite has been adequate. Concentration has been adequate. Denies SI.   Did well in school this past semester. Reports marital stress and relationship has improved.   Past Psychiatric Medication Trials: Wellbutrin- Helpful for concentration. He reports that it has helped some with depression. Reports that wife thinks this causes him to be "on edge." Was on 300 mg. Lexapro- Sexual side effects. Was on 20 mg and was reduced to 10 mg po qd this past month. Side effects have improved some at lower dose. Ineffective.  Sertraline- Sexual side effects Buspar Adderall XR- Had to be doing something all the time and was "overly focused." Had difficulty sleeping at higher dose. Stopped 5-6 years Adderall- Had insomnia and increased energy Strattera- did not start due to cost Propranolol- Ineffective Guanfacine  Reports that son has been responding well to Guanfacine.  Review of Systems:  Review of Systems  Musculoskeletal: Negative for gait problem.  Neurological: Negative for tremors.  Psychiatric/Behavioral:       Please refer to HPI    Medications: I have reviewed the patient's current medications.  Current Outpatient Medications  Medication Sig Dispense Refill  . busPIRone (BUSPAR) 30 MG tablet Take 1 tablet (30 mg total) by mouth 2 (two) times daily. 60 tablet 1  . guanFACINE (TENEX) 2 MG tablet Take 1 tablet (2 mg total) by mouth at bedtime.  30 tablet 1  . naproxen (NAPROSYN) 500 MG tablet Take 1 tablet (500 mg total) by mouth 2 (two) times daily with a meal. 60 tablet 0  . cyclobenzaprine (FLEXERIL) 10 MG tablet Take 1 tablet (10 mg total) by mouth 3 (three) times daily as needed for muscle spasms. (Patient not taking: Reported on 02/22/2019) 30 tablet 0  . ibuprofen  (ADVIL) 800 MG tablet Take 1 tablet (800 mg total) by mouth every 8 (eight) hours as needed. (Patient not taking: Reported on 02/22/2019) 30 tablet 0   No current facility-administered medications for this visit.    Medication Side Effects: Other: Brief dizziness in the morning after taking Buspar  Allergies: No Known Allergies  History reviewed. No pertinent past medical history.  Family History  Problem Relation Age of Onset  . Depression Mother   . Alcohol abuse Father     Social History   Socioeconomic History  . Marital status: Married    Spouse name: Not on file  . Number of children: Not on file  . Years of education: Not on file  . Highest education level: Not on file  Occupational History  . Not on file  Tobacco Use  . Smoking status: Never Smoker  . Smokeless tobacco: Never Used  Substance and Sexual Activity  . Alcohol use: Yes    Comment: rare/occ  . Drug use: No  . Sexual activity: Not on file  Other Topics Concern  . Not on file  Social History Narrative  . Not on file   Social Determinants of Health   Financial Resource Strain:   . Difficulty of Paying Living Expenses: Not on file  Food Insecurity:   . Worried About Programme researcher, broadcasting/film/video in the Last Year: Not on file  . Ran Out of Food in the Last Year: Not on file  Transportation Needs:   . Lack of Transportation (Medical): Not on file  . Lack of Transportation (Non-Medical): Not on file  Physical Activity:   . Days of Exercise per Week: Not on file  . Minutes of Exercise per Session: Not on file  Stress:   . Feeling of Stress : Not on file  Social Connections:   . Frequency of Communication with Friends and Family: Not on file  . Frequency of Social Gatherings with Friends and Family: Not on file  . Attends Religious Services: Not on file  . Active Member of Clubs or Organizations: Not on file  . Attends Banker Meetings: Not on file  . Marital Status: Not on file  Intimate  Partner Violence:   . Fear of Current or Ex-Partner: Not on file  . Emotionally Abused: Not on file  . Physically Abused: Not on file  . Sexually Abused: Not on file    Past Medical History, Surgical history, Social history, and Family history were reviewed and updated as appropriate.   Please see review of systems for further details on the patient's review from today.   Objective:   Physical Exam:  Wt 228 lb (103.4 kg)   BMI 30.92 kg/m   Physical Exam Neurological:     Mental Status: He is alert and oriented to person, place, and time.     Cranial Nerves: No dysarthria.  Psychiatric:        Attention and Perception: Attention and perception normal.        Mood and Affect: Mood is anxious.        Speech: Speech normal.  Behavior: Behavior is cooperative.        Thought Content: Thought content normal. Thought content is not paranoid or delusional. Thought content does not include homicidal or suicidal ideation. Thought content does not include homicidal or suicidal plan.        Cognition and Memory: Cognition and memory normal.        Judgment: Judgment normal.     Comments: Insight intact     Lab Review:     Component Value Date/Time   NA 139 11/14/2017 0827   K 4.4 11/14/2017 0827   CL 101 11/14/2017 0827   CO2 30 11/14/2017 0827   GLUCOSE 85 11/14/2017 0827   BUN 18 11/14/2017 0827   CREATININE 0.84 11/14/2017 0827   CALCIUM 9.5 11/14/2017 0827   PROT 7.3 11/14/2017 0827   ALBUMIN 4.5 11/14/2017 0827   AST 25 11/14/2017 0827   ALT 20 11/14/2017 0827   ALKPHOS 36 (L) 11/14/2017 0827   BILITOT 0.6 11/14/2017 0827       Component Value Date/Time   WBC 4.2 11/14/2017 0827   RBC 4.87 11/14/2017 0827   HGB 14.0 11/14/2017 0827   HCT 41.6 11/14/2017 0827   PLT 261.0 11/14/2017 0827   MCV 85.5 11/14/2017 0827   MCHC 33.6 11/14/2017 0827   RDW 13.7 11/14/2017 0827   LYMPHSABS 1.1 11/14/2017 0827   MONOABS 0.4 11/14/2017 0827   EOSABS 0.1 11/14/2017  0827   BASOSABS 0.0 11/14/2017 0827    No results found for: POCLITH, LITHIUM   No results found for: PHENYTOIN, PHENOBARB, VALPROATE, CBMZ   .res Assessment: Plan:   Patient seen for 30 minutes and time spent counseling patient and discussing treatment options.  Discussed potential benefits, risks, and side effects of increasing guanfacine to 2 mg daily.  Also discussed option of switching to extended release guanfacine, however patient would prefer to continue immediate release due to cost. Will continue BuSpar 30 mg twice daily for anxiety. Continue psychotherapy for individual and marital therapy. Patient to follow-up with this provider in 4 weeks or sooner if clinically indicated. Patient advised to contact office with any questions, adverse effects, or acute worsening in signs and symptoms.  Qunicy was seen today for anxiety, add and other.  Diagnoses and all orders for this visit:  Attention deficit hyperactivity disorder (ADHD), unspecified ADHD type -     guanFACINE (TENEX) 2 MG tablet; Take 1 tablet (2 mg total) by mouth at bedtime.  Anxiety disorder, unspecified type -     busPIRone (BUSPAR) 30 MG tablet; Take 1 tablet (30 mg total) by mouth 2 (two) times daily. -     guanFACINE (TENEX) 2 MG tablet; Take 1 tablet (2 mg total) by mouth at bedtime.    Please see After Visit Summary for patient specific instructions.  Future Appointments  Date Time Provider Department Center  02/28/2019 11:00 AM May, [place], [facility] None  02/28/2019  1:00 PM [name], [facility] None  03/14/2019 10:00 AM MayGelene Mink, [facility] None    No orders of the defined types were placed in this encounter.     -------------------------------

## 2019-02-28 ENCOUNTER — Encounter: Payer: Self-pay | Admitting: Psychiatry

## 2019-02-28 ENCOUNTER — Ambulatory Visit (INDEPENDENT_AMBULATORY_CARE_PROVIDER_SITE_OTHER): Payer: 59 | Admitting: Psychiatry

## 2019-02-28 ENCOUNTER — Ambulatory Visit: Payer: 59 | Admitting: Mental Health

## 2019-02-28 DIAGNOSIS — F909 Attention-deficit hyperactivity disorder, unspecified type: Secondary | ICD-10-CM

## 2019-02-28 NOTE — Progress Notes (Signed)
[facility] Counselor/Therapist Progress Note  Patient ID: Craig Fletcher, MRN: [id number],    Date: 02/28/2019  Time Spent: 45 minutes   Treatment Type: Individual Therapy  Reported Symptoms: sad, anxious  Mental Status Exam:  Appearance:   Casual     Behavior:  Appropriate  Motor:  Normal  Speech/Language:   Clear and Coherent  Affect:  Appropriate  Mood:  anxious and sad  Thought process:  normal  Thought content:    WNL  Sensory/Perceptual disturbances:    WNL  Orientation:  oriented to person, place, time/date and situation  Attention:  Good  Concentration:  Good  Memory:  WNL  Fund of knowledge:   Good  Insight:    Good  Judgment:   Good  Impulse Control:  Good   Risk Assessment: Danger to Self:  No Self-injurious Behavior: No Danger to Others: No Duty to Warn:no Physical Aggression / Violence:No  Access to Firearms a concern: No  Gang Involvement:No   Subjective: I connected with this patient by an approved telecommunication method (video), with his informed consent, and verifying identity and patient privacy.  I was located at my office and patient at his home.  As needed, we discussed the limitations, risks, and security and privacy concerns associated with telehealth service, including the availability and conditions which currently govern in-person appointments and the possibility that 3rd-party payment Vikash Nest not be fully guaranteed and he Jaycion Treml be responsible for charges.  After he indicated understanding, we proceeded with the session.  Also discussed treatment planning, as needed, including ongoing verbal agreement with the plan, the opportunity to ask and answer all questions, his demonstrated understanding of instructions, and his readiness to call the office should symptoms worsen or he feels he is in a crisis state and needs more immediate and tangible assistance.  The client is just coming off quarantine today which is why we are doing telehealth.   "Having to be home brought up issues for me.  I felt useless.  I still have to deal with my son."  The client states that he really likes to be at work talking to his coworkers.  During this quarantine he could not leave the house and had very little to do.  This amplified a lack of productivity that he was feeling.  We discussed the fact that he gets his value from his work.  He likes to contribute and that is a positive thing for him.  I asked the client if he had any hobbies.  He does some woodworking.  He has really no other things he does.  I asked the client to think about listening to books or hand carving wood.  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  We discussed that the next time he has to be out of work that he will have figured out some things he could do on his own that would be satisfying. He states he and his wife are doing better.  "She helps out more."  His anxiety and depression really lifted when he was able to get back to work.  The client will pay attention to this and try to be more proactive with his self-care.   Interventions: Assertiveness/Communication, Motivational Interviewing, Solution-Oriented/Positive Psychology and Insight-Oriented  Diagnosis:   ICD-10-CM   1. Attention deficit hyperactivity disorder (ADHD), unspecified ADHD type  F90.9     Plan: Engaged activities, positive  self talk, self-care, exercise, being proactive.  Gelene Mink Martisha T[place], [facility]

## 2019-03-02 ENCOUNTER — Other Ambulatory Visit: Payer: Self-pay | Admitting: Psychiatry

## 2019-03-14 ENCOUNTER — Ambulatory Visit (INDEPENDENT_AMBULATORY_CARE_PROVIDER_SITE_OTHER): Payer: 59 | Admitting: Psychiatry

## 2019-03-14 ENCOUNTER — Encounter: Payer: Self-pay | Admitting: Psychiatry

## 2019-03-14 DIAGNOSIS — F4321 Adjustment disorder with depressed mood: Secondary | ICD-10-CM

## 2019-03-14 NOTE — Progress Notes (Signed)
[facility] Counselor/Therapist Progress Note  Patient ID: Craig Fletcher, MRN: [id number],    Date: 03/14/2019  Time Spent: 50 minutes   Treatment Type: Individual Therapy  Reported Symptoms: frustration   Mental Status Exam:  Appearance:   Casual     Behavior:  Appropriate  Motor:  Normal  Speech/Language:   Clear and Coherent  Affect:  Appropriate  Mood:  irritable  Thought process:  normal  Thought content:    WNL  Sensory/Perceptual disturbances:    WNL  Orientation:  oriented to person, place, time/date and situation  Attention:  Good  Concentration:  Good  Memory:  WNL  Fund of knowledge:   Good  Insight:    Good  Judgment:   Good  Impulse Control:  Good   Risk Assessment: Danger to Self:  No Self-injurious Behavior: No Danger to Others: No Duty to Warn:no Physical Aggression / Violence:No  Access to Firearms a concern: No  Gang Involvement:No   Subjective: I connected with this patient by an approved telecommunication method (video), with his informed consent, and verifying identity and patient privacy.  I was located at my office and patient at his home.  As needed, we discussed the limitations, risks, and security and privacy concerns associated with telehealth service, including the availability and conditions which currently govern in-person appointments and the possibility that 3rd-party payment Conroy Goracke not be fully guaranteed and he Adalina Dopson be responsible for charges.  After he indicated understanding, we proceeded with the session.  Also discussed treatment planning, as needed, including ongoing verbal agreement with the plan, the opportunity to ask and answer all questions, his demonstrated understanding of instructions, and his readiness to call the office should symptoms worsen or he feels he is in a crisis state and needs more immediate and tangible assistance. The client stated that he was home today taking care of his son because his wife had to  unexpectedly go into the hospital for work.  She had recently tested positive for COVID.  Now that her quarantine is over she is back to her regular schedule.  The client stated that the time home together was positive for their marriage.  The client also reports that his son is better with the medication.  He is more able to listen to what he is told and attend to school.  His son will not go back to him in person school until next year. The client states that he and his wife have done better overall.  "We had some speed bumps the last few days."  The main issue is that their son tends to sleep in their bed with them at night.  This prevents any alone time for the client with his wife and gets in the way of any significant intimacy.  We discussed some strategies for overcoming this but he admitted that his wife gets focused in on caring for the son and ends up sleeping in his room.  I suggested to the client that this be he and his wife discuss at their next marriage session.  I also suggested that when his schedule overlaps with his wife's that they arrange for childcare so they can have time together.  He states that he used to do this before COVID.  He will work on making that happen in the future. I asked the client what he needed to improve to make his marriage better.  He stated he needed to control his mouth.  He speaks impulsively  when he is tired or cranky.  The client will try to be more aware of this.  I also encouraged the client to focus more on hobbies and other activities that refresh or replenish him.  He states he likes to do projects around his house as well as some woodworking.  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  He will be more proactive to make sure that his mood stays even.  He will also use exercise and regular sleep.   Interventions: Assertiveness/Communication, Motivational Interviewing, Solution-Oriented/Positive  Psychology and Insight-Oriented  Diagnosis:   ICD-10-CM   1. Adjustment disorder with depressed mood  F43.21     Plan: Strategies for son at bedtime, schedule daytime when their schedules overlap, hobbies, exercise, positive self talk, sunlight exposure, good sleep hygiene.Albertina Parr Ysabela Keisler, [facility]

## 2019-03-26 ENCOUNTER — Telehealth: Payer: Self-pay | Admitting: *Deleted

## 2019-03-26 NOTE — Telephone Encounter (Signed)
Please call patient and tell him that it is not a medication that I have much experience with and I would like him to call the provider who prescribed the medication for him.

## 2019-03-26 NOTE — Telephone Encounter (Signed)
Patient called stating that his wife has told him that she has noticed some slurred speech at times. Patient stated that he has not nothing any himself. Patent stated that it seems that his wife notices it at night after he has taken his guanfacine. Patient stated that this has been going on for about three weeks off and on per his wife. Patient stated that he is not having any neurological symptoms, such as weakness or stroke symptoms. Patient stated that he has goggled guanfacine and that this can be a side effect. Patient stated that he is wondering if Buspar can have the same side effects? Patient stated that he is not having any symptoms at this time. Patient stated that what he read showed that any possible side effects should be reported to his doctor. ER precautions given to patient and he verbalized understanding.

## 2019-03-27 NOTE — Telephone Encounter (Signed)
Message left for patient to return my call.  

## 2019-03-28 ENCOUNTER — Ambulatory Visit: Payer: 59 | Admitting: Mental Health

## 2019-03-28 DIAGNOSIS — F4321 Adjustment disorder with depressed mood: Secondary | ICD-10-CM

## 2019-04-02 NOTE — Telephone Encounter (Signed)
Please sign and close encounter after contacting patient.

## 2019-04-03 NOTE — Telephone Encounter (Signed)
Left a detailed message advising patient that if any further questions to call our office - any questions regarding his medication he needs to contact the original prescriber.   This message will be closed.

## 2019-04-04 ENCOUNTER — Encounter: Payer: Self-pay | Admitting: Psychiatry

## 2019-04-04 ENCOUNTER — Ambulatory Visit (INDEPENDENT_AMBULATORY_CARE_PROVIDER_SITE_OTHER): Payer: 59 | Admitting: Psychiatry

## 2019-04-04 ENCOUNTER — Other Ambulatory Visit: Payer: Self-pay

## 2019-04-04 DIAGNOSIS — F4323 Adjustment disorder with mixed anxiety and depressed mood: Secondary | ICD-10-CM

## 2019-04-04 NOTE — Progress Notes (Signed)
      [facility] Counselor/Therapist Progress Note  Patient ID: Craig Fletcher, MRN: [id number],    Date: 04/04/2019  Time Spent: 50 minutes   Treatment Type: Individual Therapy  Reported Symptoms: anxiosu, frustrated  Mental Status Exam:  Appearance:   Casual     Behavior:  Appropriate  Motor:  Normal  Speech/Language:   Clear and Coherent  Affect:  Appropriate  Mood:  anxious and irritable  Thought process:  normal  Thought content:    WNL  Sensory/Perceptual disturbances:    WNL  Orientation:  oriented to person, place, time/date and situation  Attention:  Good  Concentration:  Good  Memory:  WNL  Fund of knowledge:   Good  Insight:    Good  Judgment:   Good  Impulse Control:  Good   Risk Assessment: Danger to Self:  No Self-injurious Behavior: No Danger to Others: No Duty to Warn:no Physical Aggression / Violence:No  Access to Firearms a concern: No  Gang Involvement:No   Subjective: The client feels that he and his wife are doing better.  Currently he is finishing up school in 2 weeks and then he will have a break until November.  He has lost some weight which he feels better about.  With childcare he feels he and his wife are more on the same page.  Today we focused on the client's anxiety and frustration that he feels with his son.  His negative cognition is, "he is disrespectful."  He feels anxiety and frustration in his chest and  his subjective units of distress equals 8+.  As the client processed using eye-movement he talked about the difficulty that he has with his son.  His son will do things at times that feel deliberate but are probably more just impulsive behavior.  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  This will be have to be done over and over again.  Also to use reflective listening with his son getting him to repeat back what the client has said to him.  Because of his son's attention deficit and hyperactivity I suggested to  the client that he get his son as much exercise as possible.  Simple things such as getting him to run up and down the driveway while the client times him.  Taking a walk with the client or the client running and his son riding his bike.  The client will try and do all of these.  As we continue to process the client subjective units of distress went down to a 5 but not much below that.  I believe his expectations for his son's behavior Craig Fletcher be a little too high.  The client agrees that he is doing the "best that he can.  "We use this as his positive cognition at the end of the session.  Interventions: Assertiveness/Communication, Motivational Interviewing, Solution-Oriented/Positive Psychology, [employer] Desensitization and Reprocessing (EMDR) and Insight-Oriented  Diagnosis:   ICD-10-CM   1. Adjustment disorder with mixed anxiety and depressed mood  F43.23     Plan: Reflective listening, boundaries, assertiveness, self-care, exercise, positive self talk.  Craig Fletcher, [facility]

## 2019-04-11 ENCOUNTER — Ambulatory Visit (INDEPENDENT_AMBULATORY_CARE_PROVIDER_SITE_OTHER): Payer: 59 | Admitting: Psychiatry

## 2019-04-11 ENCOUNTER — Encounter: Payer: Self-pay | Admitting: Psychiatry

## 2019-04-11 DIAGNOSIS — F4323 Adjustment disorder with mixed anxiety and depressed mood: Secondary | ICD-10-CM | POA: Diagnosis not present

## 2019-04-11 NOTE — Progress Notes (Signed)
      [facility] Counselor/Therapist Progress Note  Patient ID: Craig Fletcher, MRN: [id number],    Date: 04/11/2019  Time Spent: 50 minutes   Treatment Type: Individual Therapy  Reported Symptoms: sad, frustrated, anxious  Mental Status Exam:  Appearance:   Casual     Behavior:  Appropriate  Motor:  Normal  Speech/Language:   Clear and Coherent  Affect:  Appropriate  Mood:  anxious, irritable and sad  Thought process:  normal  Thought content:    WNL  Sensory/Perceptual disturbances:    WNL  Orientation:  oriented to person, place, time/date and situation  Attention:  Good  Concentration:  Good  Memory:  WNL  Fund of knowledge:   Good  Insight:    Good  Judgment:   Good  Impulse Control:  Good   Risk Assessment: Danger to Self:  No Self-injurious Behavior: No Danger to Others: No Duty to Warn:no Physical Aggression / Violence:No  Access to Firearms a concern: No  Gang Involvement:No   Subjective: I connected with this patient by an approved telecommunication method (video), with his informed consent, and verifying identity and patient privacy.  I was located at my office and patient at his home.  As needed, we discussed the limitations, risks, and security and privacy concerns associated with telehealth service, including the availability and conditions which currently govern in-person appointments and the possibility that 3rd-party payment Mariana Wiederholt not be fully guaranteed and he Hadley Soileau be responsible for charges.  After he indicated understanding, we proceeded with the session.  Also discussed treatment planning, as needed, including ongoing verbal agreement with the plan, the opportunity to ask and answer all questions, his demonstrated understanding of instructions, and his readiness to call the office should symptoms worsen or he feels he is in a crisis state and needs more immediate and tangible assistance. "I planned to come today but my mother-in-law had a migraine."  Client  states that he is frustrated with his son because he is not paying attention to his online schooling.  "It reflects poorly on me."  The client today discussed his frustration and anxiety with his son.  His agenda has totally been up ended and he feels swamped.  Today I used eye-movement with the client focusing on his sadness, frustration and anxiety.  His subjective units of distress was a 9.  His negative cognition is, "I am overwhelmed."  He feels it in his chest.  As the client processed his subjective units of distress came down to less than 5.  Improvements have been made.  He and his wife are on the same page.  "I just have to be persistent."  I explained to the client that things with his son will cycle up and down.  But as he is persistent things will improve.  As a parent he will not always be liked but it will pay off in the end.  The client agreed.  Interventions: Assertiveness/Communication, Motivational Interviewing, Solution-Oriented/Positive Psychology, [employer] Desensitization and Reprocessing (EMDR) and Insight-Oriented  Diagnosis:   ICD-10-CM   1. Adjustment disorder with mixed anxiety and depressed mood  F43.23     Plan: Exercise, mood independent behavior, assertiveness, boundaries, self-care, positive self talk.  Gelene Mink Marcianne Ozbun, [facility]

## 2019-04-21 ENCOUNTER — Other Ambulatory Visit: Payer: Self-pay | Admitting: Psychiatry

## 2019-04-21 DIAGNOSIS — F909 Attention-deficit hyperactivity disorder, unspecified type: Secondary | ICD-10-CM

## 2019-04-21 DIAGNOSIS — F419 Anxiety disorder, unspecified: Secondary | ICD-10-CM

## 2019-04-23 NOTE — Telephone Encounter (Signed)
Apt 02/22/2019, due back 4 weeks

## 2019-04-25 ENCOUNTER — Ambulatory Visit (INDEPENDENT_AMBULATORY_CARE_PROVIDER_SITE_OTHER): Payer: 59 | Admitting: Mental Health

## 2019-04-25 DIAGNOSIS — F4323 Adjustment disorder with mixed anxiety and depressed mood: Secondary | ICD-10-CM | POA: Diagnosis not present

## 2019-04-25 NOTE — Progress Notes (Signed)
[facility] Counselor/therapist psychotherapy note  Name: Craig Fletcher Date: 04/25/19 [id number] DOB: [birth year] PCP: Emi Belfast, FNP  Time spent:  54 minutes   Treatment: Family therapy  Mental Status Exam:   Appearance:   casual  Behavior:  WNL  /Motor:  WNL  Speech/Language:   Clear and Coherent  Affect:  Full range  Mood:  depressed  Thought process:  normal  Thought content:    WNL  Sensory/Perceptual disturbances:    WNL  Orientation:  x4  Attention:  Good  Concentration:  Good  Memory:  WNL  Fund of knowledge:   Good  Insight:    Good  Judgment:   Good  Impulse Control:  Good   Reported Symptoms:  Anxious, irritability, distractible, sadness, distractible, some problems w/ focus  Risk Assessment: Danger to Self:  No Self-injurious Behavior: No Danger to Others: No Duty to Warn:no Physical Aggression / Violence:No  Access to Firearms a concern: No  Gang Involvement:No  Patient / guardian was educated about steps to take if suicide or homicide risk level increases between visits: yes While future psychiatric events cannot be accurately predicted, the patient does not currently require acute inpatient psychiatric care and does not currently meet [facility] involuntary commitment criteria.    Subjective:    They are having some continued problems w/ their son's behaviors. They are trying some new medications and are hopeful but it has been very stressful for them as a couple. He stated he get upset when his son acts out, some hopeless statements then are made. When he comes home from work after being gone for 24 hours, feels overwhelmed with his son's behavior. She wants him to stop making negative comments about their son, especially when he may hear. He wants his wife to sleep in the same room w/ him, gradually avoid sleeping w/ their son, which she does due to their son's separation anxiety.  Gave assignment to list ways they are going to be  supportive of one another to discuss this weekend. We will review next session.   Interventions: Communication strategies, supportive approaches, problem solving approaches  Diagnoses:  Adjustment disorder: AD/HD   Individualized Plan of Care:   1. Patient/family to engage in family psychotherapy 2-4 times a month or as needed.  2. Patient/family is to use communication, coping skills to improve family relationships.    3.  Patient will identify when he needs time to remove himself from a situation in which he may make comments that are hurtful.  Wife and patient will focus on active listening to further understand her husband's intentions as opposed to reacting impulsively.  4. Patient/family to contact this office, go to the local ED or call 911 if a crisis or emergency develops between visits.  Progress: Progressing   [name], [facility]

## 2019-04-26 ENCOUNTER — Ambulatory Visit: Payer: 59 | Attending: Internal Medicine

## 2019-04-26 DIAGNOSIS — Z23 Encounter for immunization: Secondary | ICD-10-CM

## 2019-04-26 NOTE — Progress Notes (Signed)
   Covid-19 Vaccination Clinic  Name:  Craig Fletcher    MRN: [id number] DOB: 03/28/1985  04/26/2019  Craig Fletcher was observed post Covid-19 immunization for 15 minutes without incident. He was provided with Vaccine Information Sheet and instruction to access the V-Safe system.   Craig Fletcher was instructed to call 911 with any severe reactions post vaccine: Marland Kitchen Difficulty breathing  . Swelling of face and throat  . A fast heartbeat  . A bad rash all over body  . Dizziness and weakness   Immunizations Administered    Name Date Dose VIS Date Route   Pfizer COVID-19 Vaccine 04/26/2019  1:11 PM 0.3 mL 01/26/2019 Intramuscular   Manufacturer: [employer], [employer]   Lot: [id number]   NDC: [address]-5

## 2019-05-02 ENCOUNTER — Ambulatory Visit: Payer: 59 | Admitting: Psychiatry

## 2019-05-23 ENCOUNTER — Ambulatory Visit: Payer: 59 | Admitting: Psychiatry

## 2019-05-23 ENCOUNTER — Ambulatory Visit: Payer: Self-pay

## 2019-05-25 ENCOUNTER — Ambulatory Visit: Payer: 59

## 2019-05-29 ENCOUNTER — Ambulatory Visit: Payer: 59 | Attending: Internal Medicine

## 2019-05-29 DIAGNOSIS — Z23 Encounter for immunization: Secondary | ICD-10-CM

## 2019-05-29 NOTE — Progress Notes (Signed)
   Covid-19 Vaccination Clinic  Name:  Craig Fletcher    MRN: [id number] DOB: 27-Sep-1985  05/29/2019  [name] was observed post Covid-19 immunization for 15 minutes without incident. He was provided with Vaccine Information Sheet and instruction to access the V-Safe system.   [name] was instructed to call 911 with any severe reactions post vaccine: Marland Kitchen Difficulty breathing  . Swelling of face and throat  . A fast heartbeat  . A bad rash all over body  . Dizziness and weakness   Immunizations Administered    Name Date Dose VIS Date Route   Pfizer COVID-19 Vaccine 05/29/2019  2:54 PM 0.3 mL 01/26/2019 Intramuscular   Manufacturer: [employer], [employer]   Lot: [id number]   NDC: [address]-4

## 2019-06-01 ENCOUNTER — Other Ambulatory Visit: Payer: Self-pay

## 2019-06-01 ENCOUNTER — Ambulatory Visit: Payer: 59 | Admitting: Family Medicine

## 2019-06-01 ENCOUNTER — Encounter: Payer: Self-pay | Admitting: Family Medicine

## 2019-06-01 VITALS — BP 112/70 | Temp 97.4°F | Ht 72.0 in | Wt 218.0 lb

## 2019-06-01 DIAGNOSIS — F4323 Adjustment disorder with mixed anxiety and depressed mood: Secondary | ICD-10-CM | POA: Diagnosis not present

## 2019-06-01 DIAGNOSIS — R5383 Other fatigue: Secondary | ICD-10-CM | POA: Diagnosis not present

## 2019-06-01 DIAGNOSIS — K429 Umbilical hernia without obstruction or gangrene: Secondary | ICD-10-CM | POA: Diagnosis not present

## 2019-06-01 DIAGNOSIS — K219 Gastro-esophageal reflux disease without esophagitis: Secondary | ICD-10-CM

## 2019-06-01 MED ORDER — FAMOTIDINE 20 MG PO TABS: 20.0000 mg | ORAL_TABLET | Freq: Every day | ORAL | 5 refills | Status: DC

## 2019-06-01 NOTE — Patient Instructions (Signed)
For breakthrough GERD- [place], Gaviscon   Gastroesophageal Reflux Disease, Adult Gastroesophageal reflux (GER) happens when acid from the stomach flows up into the tube that connects the mouth and the stomach (esophagus). Normally, food travels down the esophagus and stays in the stomach to be digested. However, when a person has GER, food and stomach acid sometimes move back up into the esophagus. If this becomes a more serious problem, the person may be diagnosed with a disease called gastroesophageal reflux disease (GERD). GERD occurs when the reflux:  Happens often.  Causes frequent or severe symptoms.  Causes problems such as damage to the esophagus. When stomach acid comes in contact with the esophagus, the acid may cause soreness (inflammation) in the esophagus. Over time, GERD may create small holes (ulcers) in the lining of the esophagus. What are the causes? This condition is caused by a problem with the muscle between the esophagus and the stomach (lower esophageal sphincter, or LES). Normally, the LES muscle closes after food passes through the esophagus to the stomach. When the LES is weakened or abnormal, it does not close properly, and that allows food and stomach acid to go back up into the esophagus. The LES can be weakened by certain dietary substances, medicines, and medical conditions, including:  Tobacco use.  Pregnancy.  Having a hiatal hernia.  Alcohol use.  Certain foods and beverages, such as coffee, chocolate, onions, and peppermint. What increases the risk? You are more likely to develop this condition if you:  Have an increased body weight.  Have a connective tissue disorder.  Use NSAID medicines. What are the signs or symptoms? Symptoms of this condition include:  Heartburn.  Difficult or painful swallowing.  The feeling of having a lump in the throat.  Abitter taste in the mouth.  Bad breath.  Having a large amount of saliva.  Having an  upset or bloated stomach.  Belching.  Chest pain. Different conditions can cause chest pain. Make sure you see your health care provider if you experience chest pain.  Shortness of breath or wheezing.  Ongoing (chronic) cough or a night-time cough.  Wearing away of tooth enamel.  Weight loss. How is this diagnosed? Your health care provider will take a medical history and perform a physical exam. To determine if you have mild or severe GERD, your health care provider may also monitor how you respond to treatment. You may also have tests, including:  A test to examine your stomach and esophagus with a small camera (endoscopy).  A test thatmeasures the acidity level in your esophagus.  A test thatmeasures how much pressure is on your esophagus.  A barium swallow or modified barium swallow test to show the shape, size, and functioning of your esophagus. How is this treated? The goal of treatment is to help relieve your symptoms and to prevent complications. Treatment for this condition may vary depending on how severe your symptoms are. Your health care provider may recommend:  Changes to your diet.  Medicine.  Surgery. Follow these instructions at home: Eating and drinking   Follow a diet as recommended by your health care provider. This may involve avoiding foods and drinks such as: ? Coffee and tea (with or without caffeine). ? Drinks that containalcohol. ? Energy drinks and sports drinks. ? Carbonated drinks or sodas. ? Chocolate and cocoa. ? Peppermint and mint flavorings. ? Garlic and onions. ? Horseradish. ? Spicy and acidic foods, including peppers, chili powder, curry powder, vinegar, hot sauces, and barbecue  sauce. ? Citrus fruit juices and citrus fruits, such as oranges, lemons, and limes. ? Tomato-based foods, such as red sauce, chili, salsa, and pizza with red sauce. ? Fried and fatty foods, such as donuts, french fries, potato chips, and high-fat  dressings. ? High-fat meats, such as hot dogs and fatty cuts of red and white meats, such as rib eye steak, sausage, ham, and bacon. ? High-fat dairy items, such as whole milk, butter, and cream cheese.  Eat small, frequent meals instead of large meals.  Avoid drinking large amounts of liquid with your meals.  Avoid eating meals during the 2-3 hours before bedtime.  Avoid lying down right after you eat.  Do not exercise right after you eat. Lifestyle   Do not use any products that contain nicotine or tobacco, such as cigarettes, e-cigarettes, and chewing tobacco. If you need help quitting, ask your health care provider.  Try to reduce your stress by using methods such as yoga or meditation. If you need help reducing stress, ask your health care provider.  If you are overweight, reduce your weight to an amount that is healthy for you. Ask your health care provider for guidance about a safe weight loss goal. General instructions  Pay attention to any changes in your symptoms.  Take over-the-counter and prescription medicines only as told by your health care provider. Do not take aspirin, ibuprofen, or other NSAIDs unless your health care provider told you to do so.  Wear loose-fitting clothing. Do not wear anything tight around your waist that causes pressure on your abdomen.  Raise (elevate) the head of your bed about 6 inches (15 cm).  Avoid bending over if this makes your symptoms worse.  Keep all follow-up visits as told by your health care provider. This is important. Contact a health care provider if:  You have: ? New symptoms. ? Unexplained weight loss. ? Difficulty swallowing or it hurts to swallow. ? Wheezing or a persistent cough. ? A hoarse voice.  Your symptoms do not improve with treatment. Get help right away if you:  Have pain in your arms, neck, jaw, teeth, or back.  Feel sweaty, dizzy, or light-headed.  Have chest pain or shortness of breath.  Vomit  and your vomit looks like blood or coffee grounds.  Faint.  Have stool that is bloody or black.  Cannot swallow, drink, or eat. Summary  Gastroesophageal reflux happens when acid from the stomach flows up into the esophagus. GERD is a disease in which the reflux happens often, causes frequent or severe symptoms, or causes problems such as damage to the esophagus.  Treatment for this condition may vary depending on how severe your symptoms are. Your health care provider may recommend diet and lifestyle changes, medicine, or surgery.  Contact a health care provider if you have new or worsening symptoms.  Take over-the-counter and prescription medicines only as told by your health care provider. Do not take aspirin, ibuprofen, or other NSAIDs unless your health care provider told you to do so.  Keep all follow-up visits as told by your health care provider. This is important. This information is not intended to replace advice given to you by your health care provider. Make sure you discuss any questions you have with your health care provider. Document Revised: 08/10/2017 Document Reviewed: 08/10/2017 Elsevier Patient Education  2020 [employer].

## 2019-06-01 NOTE — Progress Notes (Signed)
Subjective:    Patient ID: Craig Fletcher, male    DOB: [birth year], 34 y.o.   MRN: [id number]  HPI Chief Complaint  Patient presents with  . Anxiety  . Gastroesophageal Reflux   This is a 34 yo male who presents today with above cc. Taking a break from school. 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   Anxiety- this has been an ongoing issue. Has been seeing behavioral health. Some increased anxiety last night. Headache this am, had last Covid vaccine two days ago. Feels better today. Thinks current medication working ok.   Fatigue- sleeping ok, not exercises, takes his buspirone 30 mg daily (unable to tolerate bid, increased fatigue). 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 Some decreased libido, no ED.   Jerrye Bushy- he is having burning of his esophagus, triggered by [place] food, [place] food, spicy food. Does not bother him while sleeping. Not taking anything. Symptoms every day. Drinks 8-10 cups of coffee daily. Working on weight loss, making healthier food choices.   Knot above belly button. Comes and goes.   Review of Systems Per HPI    Objective:   Physical Exam Vitals reviewed.  Constitutional:      General: He is not in acute distress.    Appearance: Normal appearance. He is normal weight. He is not ill-appearing, toxic-appearing or diaphoretic.  HENT:     Head: Normocephalic and atraumatic.  Cardiovascular:     Rate and Rhythm: Normal rate and regular rhythm.     Heart sounds: Normal heart sounds.  Pulmonary:     Effort: Pulmonary effort is normal.     Breath sounds: Normal breath sounds.  Abdominal:     General: Abdomen is flat. Bowel sounds are normal. There is no distension.     Palpations: Abdomen is soft. There is no mass.     Tenderness: There is no abdominal tenderness. There is no guarding or rebound.     Hernia: A hernia (? very small hernia palpated above umbilicus with patient in  standing position.) is present.  Skin:    General: Skin is warm and dry.  Neurological:     Mental Status: He is alert and oriented to person, place, and time.  Psychiatric:        Mood and Affect: Mood normal.        Behavior: Behavior normal.        Thought Content: Thought content normal.        Judgment: Judgment normal.       BP 112/70 (BP Location: Left Arm, Patient Position: Sitting, Cuff Size: Normal)   Temp (!) 97.4 F (36.3 C) (Temporal)   Ht 6' (1.829 m)   Wt 218 lb (98.9 kg)   BMI 29.57 kg/m  Wt Readings from Last 3 Encounters:  06/01/19 218 lb (98.9 kg)  02/22/19 228 lb (103.4 kg)  02/15/19 230 lb (104.3 kg)   GAD 7 : Generalized Anxiety Score 06/01/2019 07/05/2018 05/31/2018 12/16/2017  Nervous, Anxious, on Edge 1 1 3 1   Control/stop worrying 1 2 1 1   Worry too much - different things 1 2 1 2   Trouble relaxing 1 1 0 1  Restless 1 1 0 1  Easily annoyed or irritable 1 2 2 2   Afraid - awful might happen 0 0 0 0  Total GAD 7 Score 6 9 7 8   Anxiety Difficulty - Very difficult Very difficult -  Assessment & Plan:  1. Gastroesophageal reflux disease, unspecified whether esophagitis present - Provided written and verbal information regarding diagnosis and treatment. - famotidine (PEPCID) 20 MG tablet; Take 1 tablet (20 mg total) by mouth at bedtime.  Dispense: 30 tablet; Refill: 5  2. Fatigue, unspecified type - Testosterone; Future - CBC with Differential/Platelet; Future - Comprehensive metabolic panel; Future - Vitamin D, 25-hydroxy; Future  3. Adjustment disorder with mixed anxiety and depressed mood - improved, continue follow up with behavioral health  5. Umbilical hernia - very small, continue to monitor for increase size  This visit occurred during the SARS-CoV-2 public health emergency.  Safety protocols were in place, including screening questions prior to the visit, additional usage of staff PPE, and extensive cleaning of exam room while  observing appropriate contact time as indicated for disinfecting solutions.      Olean Ree, FNP-BC  [facility], [facility]  06/02/2019 9:07 AM

## 2019-06-03 ENCOUNTER — Emergency Department: Payer: 59

## 2019-06-03 ENCOUNTER — Encounter: Payer: Self-pay | Admitting: Emergency Medicine

## 2019-06-03 ENCOUNTER — Encounter: Payer: Self-pay | Admitting: Family Medicine

## 2019-06-03 ENCOUNTER — Emergency Department
Admission: EM | Admit: 2019-06-03 | Discharge: 2019-06-03 | Disposition: A | Discharge status: Home / Self Care | Payer: 59 | Attending: Emergency Medicine | Admitting: Emergency Medicine

## 2019-06-03 ENCOUNTER — Other Ambulatory Visit: Payer: Self-pay

## 2019-06-03 DIAGNOSIS — R200 Anesthesia of skin: Secondary | ICD-10-CM

## 2019-06-03 HISTORY — DX: Anxiety disorder, unspecified: F41.9

## 2019-06-03 HISTORY — DX: Attention-deficit hyperactivity disorder, unspecified type: F90.9

## 2019-06-03 LAB — URINALYSIS, COMPLETE (UACMP) WITH MICROSCOPIC
Bacteria, UA: NONE SEEN
Bilirubin Urine: NEGATIVE
Glucose, UA: NEGATIVE mg/dL
Hgb urine dipstick: NEGATIVE
Ketones, ur: NEGATIVE mg/dL
Leukocytes,Ua: NEGATIVE
Nitrite: NEGATIVE
Protein, ur: NEGATIVE mg/dL
Specific Gravity, Urine: 1.003 — ABNORMAL LOW (ref 1.005–1.030)
Squamous Epithelial / HPF: NONE SEEN (ref 0–5)
pH: 7 (ref 5.0–8.0)

## 2019-06-03 LAB — CBC WITH DIFFERENTIAL/PLATELET
Abs Immature Granulocytes: 0.01 10*3/uL (ref 0.00–0.07)
Basophils Absolute: 0 10*3/uL (ref 0.0–0.1)
Basophils Relative: 1 %
Eosinophils Absolute: 0 10*3/uL (ref 0.0–0.5)
Eosinophils Relative: 0 %
HCT: 43.7 % (ref 39.0–52.0)
Hemoglobin: 14.3 g/dL (ref 13.0–17.0)
Immature Granulocytes: 0 %
Lymphocytes Relative: 24 %
Lymphs Abs: 1.3 10*3/uL (ref 0.7–4.0)
MCH: 28.3 pg (ref 26.0–34.0)
MCHC: 32.7 g/dL (ref 30.0–36.0)
MCV: 86.5 fL (ref 80.0–100.0)
Monocytes Absolute: 0.5 10*3/uL (ref 0.1–1.0)
Monocytes Relative: 9 %
Neutro Abs: 3.6 10*3/uL (ref 1.7–7.7)
Neutrophils Relative %: 66 %
Platelets: 267 10*3/uL (ref 150–400)
RBC: 5.05 MIL/uL (ref 4.22–5.81)
RDW: 12.3 % (ref 11.5–15.5)
WBC: 5.4 10*3/uL (ref 4.0–10.5)
nRBC: 0 % (ref 0.0–0.2)

## 2019-06-03 LAB — FIBRIN DERIVATIVES D-DIMER (ARMC ONLY): Fibrin derivatives D-dimer (ARMC): 86.68 ng/mL (FEU) (ref 0.00–499.00)

## 2019-06-03 LAB — COMPREHENSIVE METABOLIC PANEL
ALT: 23 U/L (ref 0–44)
AST: 24 U/L (ref 15–41)
Albumin: 5 g/dL (ref 3.5–5.0)
Alkaline Phosphatase: 40 U/L (ref 38–126)
Anion gap: 6 (ref 5–15)
BUN: 21 mg/dL — ABNORMAL HIGH (ref 6–20)
CO2: 31 mmol/L (ref 22–32)
Calcium: 9.5 mg/dL (ref 8.9–10.3)
Chloride: 102 mmol/L (ref 98–111)
Creatinine, Ser: 0.96 mg/dL (ref 0.61–1.24)
GFR calc Af Amer: >60 mL/min (ref >60)
GFR calc non Af Amer: >60 mL/min (ref >60)
Glucose, Bld: 96 mg/dL (ref 70–99)
Potassium: 4.3 mmol/L (ref 3.5–5.1)
Sodium: 139 mmol/L (ref 135–145)
Total Bilirubin: 0.9 mg/dL (ref 0.3–1.2)
Total Protein: 7.9 g/dL (ref 6.5–8.1)

## 2019-06-03 LAB — TROPONIN I (HIGH SENSITIVITY): Troponin I (High Sensitivity): 2 ng/L (ref <18)

## 2019-06-03 MED ORDER — MELOXICAM 15 MG PO TABS: 15.0000 mg | ORAL_TABLET | Freq: Every day | ORAL | 0 refills | Status: AC

## 2019-06-03 NOTE — ED Notes (Signed)
See triage note  States he had the second COVID shot last week  Did have some pain to left shoulder area   States it was relieved with IBU  But today while driving home his left arm became cool to touch and numb

## 2019-06-03 NOTE — ED Triage Notes (Signed)
Pt to ED via POV c/o left arm numbness that started on his way home this morning. Pt states that he first noticed symptoms around 0815. Pt denies numbness anywhere else. Pt does not have any slurred speech or facial droop. Pt is A& O x 4 and in NAD. Pt got his second COVID vaccine on Tuesday.

## 2019-06-03 NOTE — ED Notes (Signed)
Spoke with Dr. Derrill Kay about pt, no orders at this time

## 2019-06-03 NOTE — ED Provider Notes (Addendum)
[facility] Emergency Department Provider Note   ____________________________________________   First MD Initiated Contact with Patient 06/03/19 1009     (approximate)  I have reviewed the triage vital signs and the nursing notes.   HISTORY  Chief Complaint Numbness   HPI Craig Fletcher is a 34 y.o. male presents to the ED with wife complaining of some numbness sensation in his left shoulder and upper arm.  Patient states that this occurred while he was driving home this morning.  Patient denies any injury to his shoulder or previous injury to his neck.  Patient also states he has had some fatigue which currently is being worked up by his PCP.  Patient has a history of having Covid in December and has not felt energetic since that time.  He also continues to have some shortness of breath with exertion and some chest discomfort as well.  Patient is unaware of any recent fever or chills.  He denies any current cough.  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  Patient had his second Covid vaccine on 05/29/2019.  Currently he rates his pain as a 0/10.       Past Medical History:  Diagnosis Date  . ADHD   . Anxiety     Patient Active Problem List   Diagnosis Date Noted  . Acute sinusitis 02/13/2016  . Adjustment disorder with mixed anxiety and depressed mood 06/09/2015  . Lymphadenitis 04/16/2015  . Fatigue 09/30/2014  . ADD (attention deficit disorder) 08/02/2012    Past Surgical History:  Procedure Laterality Date  . CHEST WALL RECONSTRUCTION      Prior to Admission medications   Medication Sig Start Date End Date Taking? Authorizing Provider  busPIRone (BUSPAR) 30 MG tablet TAKE 1 TABLET (30 MG TOTAL) BY MOUTH 2 (TWO) TIMES DAILY. 04/23/19   Thayer Headings, PMHNP  famotidine (PEPCID) 20 MG tablet Take 1 tablet (20 mg total) by mouth at bedtime. 06/01/19   Elby Beck, FNP  guanFACINE (TENEX) 2 MG tablet TAKE 1 TABLET (2 MG TOTAL)  BY MOUTH AT BEDTIME. 04/23/19 06/01/19  Thayer Headings, PMHNP  ibuprofen (ADVIL) 800 MG tablet Take 1 tablet (800 mg total) by mouth every 8 (eight) hours as needed. 02/07/19   Elby Beck, FNP  meloxicam (MOBIC) 15 MG tablet Take 1 tablet (15 mg total) by mouth daily. 06/03/19 06/02/20  Johnn Hai, PA-C    Allergies Patient has no known allergies.  Family History  Problem Relation Age of Onset  . Depression Mother   . Alcohol abuse Father     Social History Social History   Tobacco Use  . Smoking status: Never Smoker  . Smokeless tobacco: Never Used  Substance Use Topics  . Alcohol use: Yes    Comment: rare/occ  . Drug use: No    Review of Systems Constitutional: No fever/chills.  Positive for fatigue. Eyes: No visual changes. ENT: No sore throat.  Currently no change in taste or smell. Cardiovascular: Denies chest pain. Respiratory: Denies shortness of breath.  Negative for cough. Gastrointestinal: No abdominal pain.  No nausea, no vomiting.  No diarrhea.  No constipation. Genitourinary: Negative for dysuria. Musculoskeletal: Positive for left shoulder and upper arm pain. Skin: Negative for rash. Neurological: Negative for headaches, focal weakness or numbness. ____________________________________________   PHYSICAL EXAM:  VITAL SIGNS: ED Triage Vitals  Enc Vitals Group     BP 06/03/19 0932 (!) 143/81     Pulse Rate  06/03/19 0932 70     Resp 06/03/19 0932 16     Temp 06/03/19 0932 97.8 F (36.6 C)     Temp Source 06/03/19 0932 Oral     SpO2 06/03/19 0932 96 %     Weight 06/03/19 0928 222 lb 3.6 oz (100.8 kg)     Height 06/03/19 0933 6' (1.829 m)     Head Circumference --      Peak Flow --      Pain Score 06/03/19 0933 0     Pain Loc --      Pain Edu? --      Excl. in [place]? --    Constitutional: Alert and oriented. Well appearing and in no acute distress. Eyes: Conjunctivae are normal. PERRL. EOMI. Head: Atraumatic. Neck: No stridor.     Cardiovascular: Normal rate, regular rhythm. Grossly normal heart sounds.  Good peripheral circulation. Respiratory: Normal respiratory effort.  No retractions. Lungs CTAB. Gastrointestinal: Soft and nontender. No distention.  Musculoskeletal: Moves upper and lower extremities they have difficulty.  Normal gait was noted.  On further inspection of the left shoulder and upper arm there is no gross deformity, soft tissue edema or discoloration.  Skin is intact.  Pulses present.  Motor sensory function intact.  Patient is able to move in all planes without any restriction.  No crepitus is noted with range of motion. Neurologic:  Normal speech and language. No gross focal neurologic deficits are appreciated. No gait instability. Skin:  Skin is warm, dry and intact.  No rash, no abrasion and no  skin discoloration Psychiatric: Mood and affect are normal. Speech and behavior are normal.  ____________________________________________   LABS (all labs ordered are listed, but only abnormal results are displayed)  Labs Reviewed  COMPREHENSIVE METABOLIC PANEL - Abnormal; Notable for the following components:      Result Value   BUN 21 (*)    All other components within normal limits  URINALYSIS, COMPLETE (UACMP) WITH MICROSCOPIC - Abnormal; Notable for the following components:   Color, Urine STRAW (*)    APPearance CLEAR (*)    Specific Gravity, Urine 1.003 (*)    All other components within normal limits  CBC WITH DIFFERENTIAL/PLATELET  FIBRIN DERIVATIVES D-DIMER ([facility] ONLY)  TROPONIN I (HIGH SENSITIVITY)  TROPONIN I (HIGH SENSITIVITY)   ____________________________________________  EKG  Reviewed by major doctors. Normal sinus rhythm with ventricular rate of 67. ____________________________________________  RADIOLOGY  Official radiology report(s): DG Chest 2 View  Result Date: 06/03/2019 CLINICAL DATA:  Left arm numbness beginning today. EXAM: CHEST - 2 VIEW COMPARISON:  08/13/2008  FINDINGS: Normal sized heart. Clear lungs with normal vascularity. Stable pectus excavatum. IMPRESSION: No acute abnormality. Electronically Signed   By: Beckie Salts M.D.   On: 06/03/2019 11:38    ____________________________________________   PROCEDURES  Procedure(s) performed (including Critical Care):  Procedures  ____________________________________________   INITIAL IMPRESSION / ASSESSMENT AND PLAN / ED COURSE  As part of my medical decision making, I reviewed the following data within the electronic MEDICAL RECORD NUMBER Notes from prior ED visits and [place] Controlled Substance Database  34 year old male presents to the ED with complaint of left shoulder and upper arm numbness without history of injury.  Patient had his second Covid vaccine 5 days ago.  He also gives a history of having Covid in December 2020.  He reports that he still has problems with fatigue which is currently being worked up by his PCP.  He also has some chest  pain and shortness of breath.  He denies any fever or chills.  He denies any injury to his arm or previous cervical injuries.  Physical exam is unremarkable.  Lab work including troponin and D-dimer were unremarkable.  Basic labs were negative.  Chest x-ray did not show any acute cardia pulmonary disease.  EKG showed normal sinus rhythm with a ventricular rate of 67.  Patient was reassured.  He will continue taking his prescribed medication.  He will discontinue taking ibuprofen and we discussed taking meloxicam once daily with food.  Patient has an appointment for lab work for continued work-up of his fatigue.  Patient was reassured with today's lab work and image findings.  He is to return to the emergency department immediately if any of his symptoms worsen or any urgent concerns.  At this time there is no findings to explain his arm numbness.  ____________________________________________   FINAL CLINICAL IMPRESSION(S) / ED DIAGNOSES  Final diagnoses:  Arm  numbness left     ED Discharge Orders         Ordered    meloxicam (MOBIC) 15 MG tablet  Daily     06/03/19 1226           Note:  This document was prepared using Dragon voice recognition software and may include unintentional dictation errors.    Tommi Rumps, PA-C 06/03/19 1530    Tommi Rumps, PA-C 06/03/19 1530    Phineas Semen, MD 06/03/19 1539

## 2019-06-03 NOTE — ED Notes (Signed)
Pt got COVID shot on Tuesday in left arm

## 2019-06-03 NOTE — Discharge Instructions (Signed)
Follow-up with your primary care provider on Monday about additional test including a TSH to evaluate your fatigue.  Lab work today is within normal limits.  Chest x-ray did not show any acute cardiopulmonary disease.  Troponin and D-dimer also were not suspicious for any acute cardiac or suspicious for a blood clot.  Return to the emergency department if any severe worsening of your symptoms or urgent concerns.

## 2019-06-03 NOTE — ED Notes (Signed)
Pt transported for xray.

## 2019-06-04 ENCOUNTER — Ambulatory Visit: Payer: Self-pay

## 2019-06-06 ENCOUNTER — Other Ambulatory Visit: Payer: Self-pay

## 2019-06-06 ENCOUNTER — Ambulatory Visit: Payer: 59 | Admitting: Psychiatry

## 2019-06-06 ENCOUNTER — Ambulatory Visit (INDEPENDENT_AMBULATORY_CARE_PROVIDER_SITE_OTHER): Payer: 59 | Admitting: Mental Health

## 2019-06-06 ENCOUNTER — Other Ambulatory Visit (INDEPENDENT_AMBULATORY_CARE_PROVIDER_SITE_OTHER): Payer: 59

## 2019-06-06 DIAGNOSIS — R5383 Other fatigue: Secondary | ICD-10-CM

## 2019-06-06 DIAGNOSIS — F4323 Adjustment disorder with mixed anxiety and depressed mood: Secondary | ICD-10-CM

## 2019-06-06 LAB — COMPREHENSIVE METABOLIC PANEL
ALT: 16 U/L (ref 0–53)
AST: 20 U/L (ref 0–37)
Albumin: 4.6 g/dL (ref 3.5–5.2)
Alkaline Phosphatase: 38 U/L — ABNORMAL LOW (ref 39–117)
BUN: 19 mg/dL (ref 6–23)
CO2: 35 mEq/L — ABNORMAL HIGH (ref 19–32)
Calcium: 9.6 mg/dL (ref 8.4–10.5)
Chloride: 99 mEq/L (ref 96–112)
Creatinine, Ser: 1.1 mg/dL (ref 0.40–1.50)
GFR: 76.7 mL/min (ref >60.00)
Glucose, Bld: 66 mg/dL — ABNORMAL LOW (ref 70–99)
Potassium: 4.6 mEq/L (ref 3.5–5.1)
Sodium: 137 mEq/L (ref 135–145)
Total Bilirubin: 0.6 mg/dL (ref 0.2–1.2)
Total Protein: 6.8 g/dL (ref 6.0–8.3)

## 2019-06-06 LAB — TESTOSTERONE: Testosterone: 238.36 ng/dL — ABNORMAL LOW (ref 300.00–890.00)

## 2019-06-06 LAB — VITAMIN D 25 HYDROXY (VIT D DEFICIENCY, FRACTURES): VITD: 32.92 ng/mL (ref 30.00–100.00)

## 2019-06-06 NOTE — Progress Notes (Signed)
[facility] Counselor/therapist psychotherapy note  Name: Craig Fletcher Date: 06/06/19  OEV:/[id number] DOB: [birth year] PCP: Elby Beck, FNP  Time spent:  54 minutes   Treatment: Family therapy  Mental Status Exam:   Appearance:   casual  Behavior:  WNL  /Motor:  WNL  Speech/Language:   Clear and Coherent  Affect:  Full range  Mood:  depressed  Thought process:  normal  Thought content:    WNL  Sensory/Perceptual disturbances:    WNL  Orientation:  x4  Attention:  Good  Concentration:  Good  Memory:  WNL  Fund of knowledge:   Good  Insight:    Good  Judgment:   Good  Impulse Control:  Good   Reported Symptoms:  Anxious, irritability, distractible, sadness, distractible, some problems w/ focus  Risk Assessment: Danger to Self:  No Self-injurious Behavior: No Danger to Others: No Duty to Warn:no Physical Aggression / Violence:No  Access to Firearms a concern: No  Gang Involvement:No  Patient / guardian was educated about steps to take if suicide or homicide risk level increases between visits: yes While future psychiatric events cannot be accurately predicted, the patient does not currently require acute inpatient psychiatric care and does not currently meet [facility] involuntary commitment criteria.    Subjective:    He stated they have been trying to do more things together. They recently took a short trip to the beach as a family. They are trying to get their son to sleep alone more often. He has been coping w/ a lot of fatigue recently, went to get lab work done. Last Sunday, his left arm went numb for unknown reasons. Wife took him to the ER and he checked out okay. He has been "crashing" at night due to the fatigue. He feels he and his wife are working together better in parenting; their son is also taking a medication for his AD/HD and this has been helpful. He has worked to not make any negative comments such as mumbling under his breath in frustration,  taking the time to recognize his change in thinking/physical sensations when he starts to get upset. Wife has been working on leaving her son's room to get him more independent. Some improvements w/ being supportive of one another. Wife still wants him to not walk away during discussions that are challenging.  Her feelings get hurt when he may call her lazy. She copes w/ back pain which makes it challenging w/ some tasks. She does think she could do some more tasks at times. He feels that they have worked to be mindful of their expectations of one another. They are working to validate one another's efforts and checking on the other's intentions vs making assumptions.  Due to the continued differences w/ daily tasks, they plan to come together and make a task list to find a balance.    Interventions: Communication strategies, supportive approaches, problem solving approaches  Diagnoses:  Adjustment disorder: AD/HD   Individualized Plan of Care:   1. Patient/family to engage in family psychotherapy 2-4 times a month or as needed.  2. Patient/family is to use communication, coping skills to improve family relationships.    3.  Patient will identify when he needs time to remove himself from a situation in which he may make comments that are hurtful.  Wife and patient will focus on active listening to further understand her husband's intentions as opposed to reacting impulsively.  4. Patient/family to contact this office, go to the  local ED or call 911 if a crisis or emergency develops between visits.  Progress: Progressing   [name], [facility]

## 2019-06-07 ENCOUNTER — Telehealth: Payer: Self-pay | Admitting: Family Medicine

## 2019-06-07 ENCOUNTER — Encounter: Payer: Self-pay | Admitting: Family Medicine

## 2019-06-07 LAB — CBC WITH DIFFERENTIAL/PLATELET
Basophils Absolute: 0.1 10*3/uL (ref 0.0–0.1)
Basophils Relative: 1.1 % (ref 0.0–3.0)
Eosinophils Absolute: 0.1 10*3/uL (ref 0.0–0.7)
Eosinophils Relative: 1.5 % (ref 0.0–5.0)
HCT: 40.9 % (ref 39.0–52.0)
Hemoglobin: 13.6 g/dL (ref 13.0–17.0)
Lymphocytes Relative: 30 % (ref 12.0–46.0)
Lymphs Abs: 1.4 10*3/uL (ref 0.7–4.0)
MCHC: 33.2 g/dL (ref 30.0–36.0)
MCV: 87.5 fl (ref 78.0–100.0)
Monocytes Absolute: 0.4 10*3/uL (ref 0.1–1.0)
Monocytes Relative: 9.6 % (ref 3.0–12.0)
Neutro Abs: 2.6 10*3/uL (ref 1.4–7.7)
Neutrophils Relative %: 57.8 % (ref 43.0–77.0)
Platelets: 273 10*3/uL (ref 150.0–400.0)
RBC: 4.68 Mil/uL (ref 4.22–5.81)
RDW: 13.2 % (ref 11.5–15.5)
WBC: 4.6 10*3/uL (ref 4.0–10.5)

## 2019-06-07 NOTE — Telephone Encounter (Signed)
Pt wants a call back about his lab results. I explained his pcp hasn't put any notes in yet but we will have someone call once they are looked over.

## 2019-06-08 ENCOUNTER — Other Ambulatory Visit: Payer: Self-pay | Admitting: Family Medicine

## 2019-06-08 ENCOUNTER — Encounter: Payer: Self-pay | Admitting: Family Medicine

## 2019-06-08 DIAGNOSIS — R5383 Other fatigue: Secondary | ICD-10-CM

## 2019-06-08 DIAGNOSIS — R7989 Other specified abnormal findings of blood chemistry: Secondary | ICD-10-CM

## 2019-06-18 ENCOUNTER — Encounter: Payer: Self-pay | Admitting: Emergency Medicine

## 2019-06-18 ENCOUNTER — Emergency Department: Payer: 59

## 2019-06-18 ENCOUNTER — Encounter: Payer: Self-pay | Admitting: Family Medicine

## 2019-06-18 ENCOUNTER — Emergency Department
Admission: EM | Admit: 2019-06-18 | Discharge: 2019-06-18 | Disposition: A | Discharge status: Home / Self Care | Payer: 59 | Attending: Emergency Medicine | Admitting: Emergency Medicine

## 2019-06-18 DIAGNOSIS — R0789 Other chest pain: Secondary | ICD-10-CM | POA: Diagnosis not present

## 2019-06-18 DIAGNOSIS — R200 Anesthesia of skin: Secondary | ICD-10-CM | POA: Insufficient documentation

## 2019-06-18 DIAGNOSIS — Z79899 Other long term (current) drug therapy: Secondary | ICD-10-CM | POA: Diagnosis not present

## 2019-06-18 DIAGNOSIS — R202 Paresthesia of skin: Secondary | ICD-10-CM | POA: Insufficient documentation

## 2019-06-18 DIAGNOSIS — R079 Chest pain, unspecified: Secondary | ICD-10-CM | POA: Diagnosis present

## 2019-06-18 LAB — CBC
HCT: 42.1 % (ref 39.0–52.0)
Hemoglobin: 13.8 g/dL (ref 13.0–17.0)
MCH: 28.7 pg (ref 26.0–34.0)
MCHC: 32.8 g/dL (ref 30.0–36.0)
MCV: 87.5 fL (ref 80.0–100.0)
Platelets: 278 10*3/uL (ref 150–400)
RBC: 4.81 MIL/uL (ref 4.22–5.81)
RDW: 12.5 % (ref 11.5–15.5)
WBC: 6.6 10*3/uL (ref 4.0–10.5)
nRBC: 0 % (ref 0.0–0.2)

## 2019-06-18 LAB — BASIC METABOLIC PANEL
Anion gap: 10 (ref 5–15)
BUN: 16 mg/dL (ref 6–20)
CO2: 28 mmol/L (ref 22–32)
Calcium: 9.4 mg/dL (ref 8.9–10.3)
Chloride: 102 mmol/L (ref 98–111)
Creatinine, Ser: 0.88 mg/dL (ref 0.61–1.24)
GFR calc Af Amer: >60 mL/min (ref >60)
GFR calc non Af Amer: >60 mL/min (ref >60)
Glucose, Bld: 93 mg/dL (ref 70–99)
Potassium: 3.5 mmol/L (ref 3.5–5.1)
Sodium: 140 mmol/L (ref 135–145)

## 2019-06-18 LAB — TROPONIN I (HIGH SENSITIVITY): Troponin I (High Sensitivity): <2 ng/L (ref <18)

## 2019-06-18 NOTE — ED Provider Notes (Signed)
[facility] Emergency Department Provider Note  Time seen: 8:03 PM  I have reviewed the triage vital signs and the nursing notes.   HISTORY  Chief Complaint Chest Pain   HPI Craig Fletcher is a 34 y.o. male with a past medical history of ADHD, anxiety, presents to the emergency department for left chest pain.  According to the patient for the past 2 weeks or so he has been experiencing some intermittent tingling and numbness in his left arm, states this was shortly after getting the Pfizer vaccine.  He was initially concerned about a blood clot and he was seen in the emergency department.  Had negative work-up at that time including negative D-dimer.  Patient states today he was experiencing left-sided chest discomfort, worse with movement.  States it will come as a sharp pain lasts several seconds and then resolved.  Denies any nausea or shortness of breath.  No fever.  Overall the patient appears extremely well, no distress.   Past Medical History:  Diagnosis Date  . ADHD   . Anxiety     Patient Active Problem List   Diagnosis Date Noted  . Acute sinusitis 02/13/2016  . Adjustment disorder with mixed anxiety and depressed mood 06/09/2015  . Lymphadenitis 04/16/2015  . Fatigue 09/30/2014  . ADD (attention deficit disorder) 08/02/2012    Past Surgical History:  Procedure Laterality Date  . CHEST WALL RECONSTRUCTION      Prior to Admission medications   Medication Sig Start Date End Date Taking? Authorizing Provider  busPIRone (BUSPAR) 30 MG tablet TAKE 1 TABLET (30 MG TOTAL) BY MOUTH 2 (TWO) TIMES DAILY. 04/23/19   Corie Chiquito, PMHNP  famotidine (PEPCID) 20 MG tablet Take 1 tablet (20 mg total) by mouth at bedtime. 06/01/19   Emi Belfast, FNP  guanFACINE (TENEX) 2 MG tablet TAKE 1 TABLET (2 MG TOTAL) BY MOUTH AT BEDTIME. 04/23/19 06/01/19  Corie Chiquito, PMHNP  ibuprofen (ADVIL) 800 MG tablet Take 1 tablet (800 mg total) by mouth every 8 (eight)  hours as needed. 02/07/19   Emi Belfast, FNP  meloxicam (MOBIC) 15 MG tablet Take 1 tablet (15 mg total) by mouth daily. 06/03/19 06/02/20  Tommi Rumps, PA-C    No Known Allergies  Family History  Problem Relation Age of Onset  . Depression Mother   . Alcohol abuse Father     Social History Social History   Tobacco Use  . Smoking status: Never Smoker  . Smokeless tobacco: Never Used  Substance Use Topics  . Alcohol use: Yes    Comment: rare/occ  . Drug use: No    Review of Systems Constitutional: Negative for fever. Cardiovascular: Left chest pain Respiratory: Negative for shortness of breath. Gastrointestinal: Negative for abdominal pain, vomiting Musculoskeletal: Intermittent left arm discomfort times weeks. Neurological: Negative for headache All other ROS negative  ____________________________________________   PHYSICAL EXAM:  VITAL SIGNS: ED Triage Vitals  Enc Vitals Group     BP 06/18/19 1842 139/81     Pulse Rate 06/18/19 1842 69     Resp 06/18/19 1842 19     Temp 06/18/19 1842 98 F (36.7 C)     Temp Source 06/18/19 1842 Oral     SpO2 06/18/19 1842 96 %     Weight 06/18/19 1841 215 lb (97.5 kg)     Height 06/18/19 1841 6' (1.829 m)     Head Circumference --      Peak Flow --  Pain Score --      Pain Loc --      Pain Edu? --      Excl. in [place]? --    Constitutional: Alert and oriented. Well appearing and in no distress. Eyes: Normal exam ENT      Head: Normocephalic and atraumatic.      Mouth/Throat: Mucous membranes are moist. Cardiovascular: Normal rate, regular rhythm. No murmur Respiratory: Normal respiratory effort without tachypnea nor retractions. Breath sounds are clear  Gastrointestinal: Soft and nontender. No distention.   Musculoskeletal: Nontender with normal range of motion in all extremities.  Neurologic:  Normal speech and language. No gross focal neurologic deficits  Skin:  Skin is warm, dry and intact.   Psychiatric: Mood and affect are normal.   ____________________________________________    EKG  EKG viewed and interpreted by myself shows a normal sinus rhythm at 68 bpm with a narrow QRS, normal axis, normal intervals, no concerning ST changes.  ____________________________________________    RADIOLOGY  Chest x-ray is negative  ____________________________________________   INITIAL IMPRESSION / ASSESSMENT AND PLAN / ED COURSE  Pertinent labs & imaging results that were available during my care of the patient were reviewed by me and considered in my medical decision making (see chart for details).   Patient presents to the emergency department for left-sided chest discomfort since around 1 PM today.  Overall patient appears extremely well, reassuring physical exam, reassuring vitals.  Patient's lab work is normal including a negative troponin.  Negative chest x-ray.  Patient is PERC negative.  Highly suspect chest wall discomfort or muscular strain.  Patient does perform heavy lifting on a near daily basis per patient.  We will discharge with PCP follow-up.  Discussed supportive care.  Craig Fletcher was evaluated in Emergency Department on 06/18/2019 for the symptoms described in the history of present illness. He was evaluated in the context of the global COVID-19 pandemic, which necessitated consideration that the patient might be at risk for infection with the SARS-CoV-2 virus that causes COVID-19. Institutional protocols and algorithms that pertain to the evaluation of patients at risk for COVID-19 are in a state of rapid change based on information released by regulatory bodies including the CDC and federal and state organizations. These policies and algorithms were followed during the patient's care in the ED.  ____________________________________________   FINAL CLINICAL IMPRESSION(S) / ED DIAGNOSES  Chest wall pain   Harvest Dark, MD 06/18/19 2006

## 2019-06-18 NOTE — ED Notes (Signed)
Pin pad in room not working, pt verbalized understanding of discharge instructions and states he has no further questions.

## 2019-06-18 NOTE — ED Triage Notes (Signed)
Pt c/o left sided chest pain that radiates down the left arm. Symptoms started today at approx. 1330 with no relief by OTC medication. Pt was seen on 4/18 for left arm numbness. Pt reports that he is still experiencing the same left arm numbness. Pt denies SOB, N/V.

## 2019-06-22 DIAGNOSIS — R0683 Snoring: Secondary | ICD-10-CM | POA: Insufficient documentation

## 2019-06-22 DIAGNOSIS — E559 Vitamin D deficiency, unspecified: Secondary | ICD-10-CM | POA: Insufficient documentation

## 2019-06-22 DIAGNOSIS — R079 Chest pain, unspecified: Secondary | ICD-10-CM | POA: Insufficient documentation

## 2019-06-22 DIAGNOSIS — E291 Testicular hypofunction: Secondary | ICD-10-CM | POA: Insufficient documentation

## 2019-06-22 DIAGNOSIS — R209 Unspecified disturbances of skin sensation: Secondary | ICD-10-CM | POA: Insufficient documentation

## 2019-06-22 DIAGNOSIS — K429 Umbilical hernia without obstruction or gangrene: Secondary | ICD-10-CM | POA: Insufficient documentation

## 2019-06-22 DIAGNOSIS — Z8616 Personal history of COVID-19: Secondary | ICD-10-CM | POA: Insufficient documentation

## 2019-06-22 DIAGNOSIS — K30 Functional dyspepsia: Secondary | ICD-10-CM | POA: Insufficient documentation

## 2019-06-22 DIAGNOSIS — F419 Anxiety disorder, unspecified: Secondary | ICD-10-CM | POA: Insufficient documentation

## 2019-06-27 ENCOUNTER — Other Ambulatory Visit: Payer: Self-pay | Admitting: Psychiatry

## 2019-06-27 DIAGNOSIS — F419 Anxiety disorder, unspecified: Secondary | ICD-10-CM

## 2019-06-27 DIAGNOSIS — F909 Attention-deficit hyperactivity disorder, unspecified type: Secondary | ICD-10-CM

## 2019-07-02 ENCOUNTER — Ambulatory Visit: Payer: Self-pay | Admitting: Urology

## 2019-07-02 ENCOUNTER — Encounter: Payer: Self-pay | Admitting: Family Medicine

## 2019-07-04 ENCOUNTER — Ambulatory Visit: Payer: Self-pay | Admitting: Urology

## 2019-08-07 ENCOUNTER — Other Ambulatory Visit: Payer: Self-pay | Admitting: Psychiatry

## 2019-08-07 DIAGNOSIS — F419 Anxiety disorder, unspecified: Secondary | ICD-10-CM

## 2019-08-07 DIAGNOSIS — F909 Attention-deficit hyperactivity disorder, unspecified type: Secondary | ICD-10-CM

## 2019-08-08 NOTE — Telephone Encounter (Signed)
Last apt 02/22/19

## 2019-08-09 ENCOUNTER — Other Ambulatory Visit: Payer: Self-pay

## 2019-08-29 ENCOUNTER — Encounter: Payer: Self-pay | Admitting: Family Medicine

## 2019-09-11 ENCOUNTER — Other Ambulatory Visit: Payer: Self-pay | Admitting: Psychiatry

## 2019-09-11 DIAGNOSIS — F419 Anxiety disorder, unspecified: Secondary | ICD-10-CM

## 2019-09-11 DIAGNOSIS — F909 Attention-deficit hyperactivity disorder, unspecified type: Secondary | ICD-10-CM

## 2019-09-14 NOTE — Telephone Encounter (Signed)
Apt 02/16/19 due back 4 weeks

## 2019-10-30 ENCOUNTER — Other Ambulatory Visit: Payer: Self-pay | Admitting: Internal Medicine

## 2019-10-30 ENCOUNTER — Ambulatory Visit
Admission: RE | Admit: 2019-10-30 | Discharge: 2019-10-30 | Disposition: A | Discharge status: Home / Self Care | Payer: 59 | Source: Ambulatory Visit | Attending: Internal Medicine | Admitting: Internal Medicine

## 2019-10-30 ENCOUNTER — Other Ambulatory Visit: Payer: Self-pay

## 2019-10-30 DIAGNOSIS — R590 Localized enlarged lymph nodes: Secondary | ICD-10-CM

## 2019-10-30 MED ORDER — IOPAMIDOL (ISOVUE-300) INJECTION 61%
75.0000 mL | Freq: Once | INTRAVENOUS | Status: AC | PRN
  Administered 2019-10-30: 75 mL via INTRAVENOUS

## 2019-11-05 ENCOUNTER — Other Ambulatory Visit: Payer: Self-pay | Admitting: Psychiatry

## 2019-11-05 DIAGNOSIS — F909 Attention-deficit hyperactivity disorder, unspecified type: Secondary | ICD-10-CM

## 2019-11-05 DIAGNOSIS — F419 Anxiety disorder, unspecified: Secondary | ICD-10-CM

## 2019-11-05 NOTE — Telephone Encounter (Signed)
Last apt 02/2019 

## 2019-11-27 ENCOUNTER — Other Ambulatory Visit: Payer: Self-pay | Admitting: Psychiatry

## 2019-11-27 DIAGNOSIS — F909 Attention-deficit hyperactivity disorder, unspecified type: Secondary | ICD-10-CM

## 2019-11-27 DIAGNOSIS — F419 Anxiety disorder, unspecified: Secondary | ICD-10-CM

## 2019-11-27 NOTE — Telephone Encounter (Signed)
review 

## 2019-12-13 ENCOUNTER — Telehealth (INDEPENDENT_AMBULATORY_CARE_PROVIDER_SITE_OTHER): Payer: 59 | Admitting: Psychiatry

## 2019-12-13 ENCOUNTER — Encounter: Payer: Self-pay | Admitting: Psychiatry

## 2019-12-13 ENCOUNTER — Telehealth: Payer: Self-pay | Admitting: Psychiatry

## 2019-12-13 DIAGNOSIS — F419 Anxiety disorder, unspecified: Secondary | ICD-10-CM

## 2019-12-13 DIAGNOSIS — F909 Attention-deficit hyperactivity disorder, unspecified type: Secondary | ICD-10-CM | POA: Diagnosis not present

## 2019-12-13 MED ORDER — AMPHETAMINE-DEXTROAMPHETAMINE 10 MG PO TABS: ORAL_TABLET | ORAL | 0 refills | Status: DC

## 2019-12-13 MED ORDER — GUANFACINE HCL 2 MG PO TABS: 2.0000 mg | ORAL_TABLET | Freq: Every day | ORAL | 0 refills | Status: DC

## 2019-12-13 NOTE — Progress Notes (Signed)
Craig MerinoJoseph F Fletcher [id number] 06-29-1985 34 y.o.  Virtual Visit via Telephone Note  I connected with pt on 12/13/19 at  1:45 PM EDT by telephone and verified that I am speaking with the correct person using two identifiers.   I discussed the limitations, risks, security and privacy concerns of performing an evaluation and management service by telephone and the availability of in person appointments. I also discussed with the patient that there may be a patient responsible charge related to this service. The patient expressed understanding and agreed to proceed.   I discussed the assessment and treatment plan with the patient. The patient was provided an opportunity to ask questions and all were answered. The patient agreed with the plan and demonstrated an understanding of the instructions.   The patient was advised to call back or seek an in-person evaluation if the symptoms worsen or if the condition fails to improve as anticipated.  I provided 30 minutes of non-face-to-face time during this encounter.  The patient was located at home.  The provider was located at [facility].   Corie ChiquitoJessica Niyah Mamaril, PMHNP   Subjective:   Patient ID:  Craig MerinoJoseph F Fletcher is a 34 y.o. (DOB 06-29-1985) male.  Chief Complaint:  Chief Complaint  Patient presents with  . ADD  . Anxiety    HPI Craig MerinoJoseph F Streicher presents for follow-up of anxiety and ADD. He reports that he notice an improvement in concentration with Guanfacine in the first part of the day. He reports that he is taking it at bedtime since this causes some fatigue and drowsiness. He reports that he continues to have difficulty with concentration and focus. Has just started school back and plans to complete his degree in March.   He reports that he had dizzy feeling with taking Buspar in the morning and is therefore taking it at bedtime only. He reports that his anxiety is "almost where it needs to be." He reports that he has some anxiety when he  and his wife are communicating and he is not understanding what she is trying to communicate. He reports that he does not experience anxiety at work. He denies any panic s/s. Denies depressed mood. He reports that sometimes in the evenings he feels tired. He reports that he is sleeping well and sleep is improved with Melatonin. Appetite has been normal. Motivation has been good overall. Denies SI.   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 He reports that his wife has had multiple physical and mental health issues. He reports that their relationship is "off and on."    Past Psychiatric Medication Trials: Wellbutrin- Helpful for concentration. He reports that it has helped some with depression. Reports that wife thinks this causes him to be "on edge." Was on 300 mg. Lexapro- Sexual side effects. Was on 20 mg and was reduced to 10 mg po qd this past month. Side effects have improved some at lower dose. Ineffective.  Sertraline- Sexual side effects Buspar Adderall XR- Had to be doing something all the time and was "overly focused." Had difficulty sleeping at higher dose. Stopped 5-6 years Adderall- Had insomnia and increased energy Strattera- did not start due to cost Propranolol- Ineffective Guanfacine- Has helped with anxiety and has helped some with concentration.  Review of Systems:  Review of Systems  Musculoskeletal: Negative for gait problem.  Neurological: Negative for tremors.  Psychiatric/Behavioral:       Please refer to HPI    Medications: I have reviewed the patient's current medications.  Current Outpatient Medications  Medication Sig Dispense Refill  . busPIRone (BUSPAR) 30 MG tablet TAKE 1 TABLET BY MOUTH TWICE A DAY (Patient taking differently: Take 30 mg by mouth at bedtime. ) 60 tablet 0  . cholecalciferol (VITAMIN D3) 25 MCG (1000 UNIT) tablet Take 1,000 Units by mouth daily.    . famotidine (PEPCID) 20 MG tablet Take 1 tablet (20 mg total) by mouth at bedtime. 30 tablet 5  .  guanFACINE (TENEX) 2 MG tablet Take 1 tablet (2 mg total) by mouth at bedtime. 30 tablet 0  . ibuprofen (ADVIL) 800 MG tablet Take 1 tablet (800 mg total) by mouth every 8 (eight) hours as needed. 30 tablet 0  . melatonin 5 MG TABS Take 5 mg by mouth.    . vitamin B-12 (CYANOCOBALAMIN) 1000 MCG tablet Take 1,000 mcg by mouth daily.    Marland Kitchen amphetamine-dextroamphetamine (ADDERALL) 10 MG tablet Take 1/2-1 tab po BID 60 tablet 0  . meloxicam (MOBIC) 15 MG tablet Take 1 tablet (15 mg total) by mouth daily. (Patient not taking: Reported on 12/13/2019) 20 tablet 0   No current facility-administered medications for this visit.    Medication Side Effects: None  Allergies: No Known Allergies  Past Medical History:  Diagnosis Date  . ADHD   . Anxiety     Family History  Problem Relation Age of Onset  . Depression Mother   . Alcohol abuse Father     Social History   Socioeconomic History  . Marital status: Married    Spouse name: Not on file  . Number of children: Not on file  . Years of education: Not on file  . Highest education level: Not on file  Occupational History  . Not on file  Tobacco Use  . Smoking status: Never Smoker  . Smokeless tobacco: Never Used  Substance and Sexual Activity  . Alcohol use: Yes    Comment: rare/occ  . Drug use: No  . Sexual activity: Not on file  Other Topics Concern  . Not on file  Social History Narrative  . Not on file   Social Determinants of Health   Financial Resource Strain:   . Difficulty of Paying Living Expenses: Not on file  Food Insecurity:   . Worried About Programme researcher, broadcasting/film/video in the Last Year: Not on file  . Ran Out of Food in the Last Year: Not on file  Transportation Needs:   . Lack of Transportation (Medical): Not on file  . Lack of Transportation (Non-Medical): Not on file  Physical Activity:   . Days of Exercise per Week: Not on file  . Minutes of Exercise per Session: Not on file  Stress:   . Feeling of Stress :  Not on file  Social Connections:   . Frequency of Communication with Friends and Family: Not on file  . Frequency of Social Gatherings with Friends and Family: Not on file  . Attends Religious Services: Not on file  . Active Member of Clubs or Organizations: Not on file  . Attends Banker Meetings: Not on file  . Marital Status: Not on file  Intimate Partner Violence:   . Fear of Current or Ex-Partner: Not on file  . Emotionally Abused: Not on file  . Physically Abused: Not on file  . Sexually Abused: Not on file    Past Medical History, Surgical history, Social history, and Family history were reviewed and updated as appropriate.   Please see review of  systems for further details on the patient's review from today.   Objective:   Physical Exam:  BP 125/85   Pulse 78   Physical Exam Neurological:     Mental Status: He is alert and oriented to person, place, and time.     Cranial Nerves: No dysarthria.  Psychiatric:        Attention and Perception: Attention and perception normal.        Mood and Affect: Mood normal.        Speech: Speech normal.        Behavior: Behavior is cooperative.        Thought Content: Thought content normal. Thought content is not paranoid or delusional. Thought content does not include homicidal or suicidal ideation. Thought content does not include homicidal or suicidal plan.        Cognition and Memory: Cognition and memory normal.        Judgment: Judgment normal.     Comments: Insight intact     Lab Review:     Component Value Date/Time   NA 140 06/18/2019 1850   K 3.5 06/18/2019 1850   CL 102 06/18/2019 1850   CO2 28 06/18/2019 1850   GLUCOSE 93 06/18/2019 1850   BUN 16 06/18/2019 1850   CREATININE 0.88 06/18/2019 1850   CALCIUM 9.4 06/18/2019 1850   PROT 6.8 06/06/2019 0837   ALBUMIN 4.6 06/06/2019 0837   AST 20 06/06/2019 0837   ALT 16 06/06/2019 0837   ALKPHOS 38 (L) 06/06/2019 0837   BILITOT 0.6 06/06/2019 0837    GFRNONAA >60 06/18/2019 1850   GFRAA >60 06/18/2019 1850       Component Value Date/Time   WBC 6.6 06/18/2019 1850   RBC 4.81 06/18/2019 1850   HGB 13.8 06/18/2019 1850   HCT 42.1 06/18/2019 1850   PLT 278 06/18/2019 1850   MCV 87.5 06/18/2019 1850   MCH 28.7 06/18/2019 1850   MCHC 32.8 06/18/2019 1850   RDW 12.5 06/18/2019 1850   LYMPHSABS 1.4 06/06/2019 0837   MONOABS 0.4 06/06/2019 0837   EOSABS 0.1 06/06/2019 0837   BASOSABS 0.1 06/06/2019 0837    No results found for: POCLITH, LITHIUM   No results found for: PHENYTOIN, PHENOBARB, VALPROATE, CBMZ   .res Assessment: Plan:   Discussed possible treatment options to improve mood ADD signs and symptoms, to include increasing guanfacine or restarting Adderall.  Patient reports that he is concerned that increasing guanfacine could result in increased drowsiness.  Will continue guanfacine since this has been somewhat helpful for attention deficit signs and symptoms and helpful for his anxiety. Will start Adderall 10 mg 1/2-1 tab twice daily for attention deficit signs and symptoms. Continue BuSpar 30 mg at bedtime for anxiety. Patient to follow-up with this provider in 4 weeks or sooner if clinically indicated. Patient advised to contact office with any questions, adverse effects, or acute worsening in signs and symptoms.  Quantarius was seen today for add and anxiety.  Diagnoses and all orders for this visit:  Attention deficit hyperactivity disorder (ADHD), unspecified ADHD type -     amphetamine-dextroamphetamine (ADDERALL) 10 MG tablet; Take 1/2-1 tab po BID -     guanFACINE (TENEX) 2 MG tablet; Take 1 tablet (2 mg total) by mouth at bedtime.  Anxiety disorder, unspecified type -     guanFACINE (TENEX) 2 MG tablet; Take 1 tablet (2 mg total) by mouth at bedtime.    Please see After Visit Summary for patient specific instructions.  No future appointments.  No orders of the defined types were placed in this  encounter.     -------------------------------

## 2019-12-13 NOTE — Telephone Encounter (Signed)
Mr. leopoldo, mazzie are scheduled for a virtual visit with your provider today.    Just as we do with appointments in the office, we must obtain your consent to participate.  Your consent will be active for this visit and any virtual visit you may have with one of our providers in the next 365 days.    If you have a MyChart account, I can also send a copy of this consent to you electronically.  All virtual visits are billed to your insurance company just like a traditional visit in the office.  As this is a virtual visit, video technology does not allow for your provider to perform a traditional examination.  This may limit your provider's ability to fully assess your condition.  If your provider identifies any concerns that need to be evaluated in person or the need to arrange testing such as labs, EKG, etc, we will make arrangements to do so.    Although advances in technology are sophisticated, we cannot ensure that it will always work on either your end or our end.  If the connection with a video visit is poor, we may have to switch to a telephone visit.  With either a video or telephone visit, we are not always able to ensure that we have a secure connection.   I need to obtain your verbal consent now.   Are you willing to proceed with your visit today?   Craig Fletcher has provided verbal consent on 12/13/2019 for a virtual visit (video or telephone).   Corie Chiquito, PMHNP 12/13/2019  1:43 PM

## 2019-12-18 ENCOUNTER — Ambulatory Visit: Payer: 59 | Admitting: Psychiatry

## 2020-01-30 ENCOUNTER — Other Ambulatory Visit: Payer: Self-pay | Admitting: Psychiatry

## 2020-01-30 DIAGNOSIS — F909 Attention-deficit hyperactivity disorder, unspecified type: Secondary | ICD-10-CM

## 2020-01-30 DIAGNOSIS — F419 Anxiety disorder, unspecified: Secondary | ICD-10-CM

## 2020-02-29 ENCOUNTER — Other Ambulatory Visit: Payer: Self-pay | Admitting: Psychiatry

## 2020-02-29 DIAGNOSIS — F419 Anxiety disorder, unspecified: Secondary | ICD-10-CM

## 2020-02-29 DIAGNOSIS — F909 Attention-deficit hyperactivity disorder, unspecified type: Secondary | ICD-10-CM

## 2020-03-05 ENCOUNTER — Telehealth: Payer: Self-pay

## 2020-03-05 NOTE — Telephone Encounter (Signed)
Was on the phone with patient's wife, Craig Fletcher who sees another provider at our practice and she wanted to report to Shanda Bumps a few things that her husband probably hasn't mentioned in his apts. She did set up an apt for him with Shanda Bumps it's in the morning. She reports that he has a lot of anger management and he was suppose to pass along that Adderall increases his anger, so perhaps something else would work better for him. She reports he's very stressed right now with work, home, and covid. Patient's wife is a Engineer, civil (consulting) and working more hours. They have a son with ADHD and patient does get frustrated with him, per wife reporting that. She reports maybe he needs a mood stabilizer to help as well. They do argue and have tried marriage counseling twice and it didn't work out per wife.   Informed Craig Fletcher I would report this information prior to his apt with Shanda Bumps just so she's aware. She was appreciative.

## 2020-03-05 NOTE — Telephone Encounter (Signed)
Noted  

## 2020-03-06 ENCOUNTER — Encounter: Payer: Self-pay | Admitting: Psychiatry

## 2020-03-06 ENCOUNTER — Telehealth (INDEPENDENT_AMBULATORY_CARE_PROVIDER_SITE_OTHER): Payer: 59 | Admitting: Psychiatry

## 2020-03-06 DIAGNOSIS — F909 Attention-deficit hyperactivity disorder, unspecified type: Secondary | ICD-10-CM

## 2020-03-06 DIAGNOSIS — F419 Anxiety disorder, unspecified: Secondary | ICD-10-CM | POA: Diagnosis not present

## 2020-03-06 MED ORDER — FLUOXETINE HCL 10 MG PO CAPS: 10.0000 mg | ORAL_CAPSULE | Freq: Every day | ORAL | 1 refills | Status: DC

## 2020-03-06 MED ORDER — AMPHETAMINE-DEXTROAMPHETAMINE 10 MG PO TABS: ORAL_TABLET | ORAL | 0 refills | Status: DC

## 2020-03-06 MED ORDER — GUANFACINE HCL 2 MG PO TABS: ORAL_TABLET | ORAL | 1 refills | Status: DC

## 2020-03-06 MED ORDER — BUSPIRONE HCL 30 MG PO TABS: 15.0000 mg | ORAL_TABLET | Freq: Every day | ORAL | 0 refills | Status: DC

## 2020-03-06 NOTE — Progress Notes (Signed)
Craig Fletcher [id number] Nov 16, 1985 35 y.o.  Virtual Visit via Video Note  I connected with pt @ on 03/06/20 at  9:00 AM EST by a video enabled telemedicine application and verified that I am speaking with the correct person using two identifiers.   I discussed the limitations of evaluation and management by telemedicine and the availability of in person appointments. The patient expressed understanding and agreed to proceed.  I discussed the assessment and treatment plan with the patient. The patient was provided an opportunity to ask questions and all were answered. The patient agreed with the plan and demonstrated an understanding of the instructions.   The patient was advised to call back or seek an in-person evaluation if the symptoms worsen or if the condition fails to improve as anticipated.  I provided 30 minutes of non-face-to-face time during this encounter.  The patient was located at home.  The provider was located at [facility].   Corie Chiquito, PMHNP   Subjective:   Patient ID:  Craig Fletcher is a 35 y.o. (DOB 09/25/1985) male.  Chief Complaint:  Chief Complaint  Patient presents with  . Anxiety  . Other    Anger, irritability    HPI Craig Fletcher presents for follow-up of anxiety and ADD. He reports anger and feeling "like the world is on my shoulders." He reports having some irritability. "I feel like I am constantly trying to juggle things and doing the right things." He reports that Adderall 5 mg is helpful for concentration and focus. He reports that he had some irritability prior to starting Adderall. He reports that Guanfacine has been helpful. Buspar does not seem to be helpful. He reports that he has been sleeping ok. Takes Melatonin 5 mg QHS prn when not at work. He reports some depressed mood. Low energy and low motivation. Withdrawn at times. Denies SI.   Denies impulsive or risky behavior.   Son has been back in school and he is trying to  help son.   He completes his school in March. Was in school Oct-Dec.  He went through promotional process at work and missed promotion by "fractions of a point." He reports that he has had some work related stress and frustrations.   Past Psychiatric Medication Trials: Wellbutrin- Helpful for concentration. He reports that it has helped some with depression. Reports that wife thinks this causes him to be "on edge." Was on 300 mg. Lexapro- Sexual side effects. Was on 20 mg and was reduced to 10 mg po qd this past month. Side effects have improved some at lower dose. Ineffective.  Sertraline- Sexual side effects Buspar Adderall XR- Had to be doing something all the time and was "overly focused." Had difficulty sleeping at higher dose. Stopped 5-6 years Adderall- Had insomnia and increased energy Strattera- did not start due to cost Propranolol- Ineffective Guanfacine- Has helped with anxiety and has helped some with concentration.   Review of Systems:  Review of Systems  Cardiovascular: Negative for palpitations.       Occasional increased HR when angry  Musculoskeletal: Negative for gait problem.  Neurological: Negative for tremors.  Psychiatric/Behavioral:       Please refer to HPI    Medications: I have reviewed the patient's current medications.  Current Outpatient Medications  Medication Sig Dispense Refill  . cholecalciferol (VITAMIN D3) 25 MCG (1000 UNIT) tablet Take 1,000 Units by mouth daily.    . famotidine (PEPCID) 20 MG tablet Take 1 tablet (20 mg  total) by mouth at bedtime. (Patient taking differently: Take 20 mg by mouth daily as needed.) 30 tablet 5  . FLUoxetine (PROZAC) 10 MG capsule Take 1 capsule (10 mg total) by mouth daily. 30 capsule 1  . melatonin 5 MG TABS Take 5 mg by mouth at bedtime as needed.    . vitamin B-12 (CYANOCOBALAMIN) 1000 MCG tablet Take 1,000 mcg by mouth daily.    Marland Kitchen amphetamine-dextroamphetamine (ADDERALL) 10 MG tablet Take 1/2-1 tab po BID 60  tablet 0  . busPIRone (BUSPAR) 30 MG tablet Take 0.5 tablets (15 mg total) by mouth at bedtime. Stop after 1 week 60 tablet 0  . guanFACINE (TENEX) 2 MG tablet TAKE 1 TABLET BY MOUTH EVERYDAY AT BEDTIME 30 tablet 1  . ibuprofen (ADVIL) 800 MG tablet Take 1 tablet (800 mg total) by mouth every 8 (eight) hours as needed. 30 tablet 0  . meloxicam (MOBIC) 15 MG tablet Take 1 tablet (15 mg total) by mouth daily. (Patient not taking: Reported on 12/13/2019) 20 tablet 0   No current facility-administered medications for this visit.    Medication Side Effects: Other: Slight increased irritability with Adderall  Some hyper-focus  Allergies: No Known Allergies  Past Medical History:  Diagnosis Date  . ADHD   . Anxiety     Family History  Problem Relation Age of Onset  . Depression Mother   . Alcohol abuse Father     Social History   Socioeconomic History  . Marital status: Married    Spouse name: Not on file  . Number of children: Not on file  . Years of education: Not on file  . Highest education level: Not on file  Occupational History  . Not on file  Tobacco Use  . Smoking status: Never Smoker  . Smokeless tobacco: Never Used  Substance and Sexual Activity  . Alcohol use: Yes    Comment: rare/occ  . Drug use: No  . Sexual activity: Not on file  Other Topics Concern  . Not on file  Social History Narrative  . Not on file   Social Determinants of Health   Financial Resource Strain: Not on file  Food Insecurity: Not on file  Transportation Needs: Not on file  Physical Activity: Not on file  Stress: Not on file  Social Connections: Not on file  Intimate Partner Violence: Not on file    Past Medical History, Surgical history, Social history, and Family history were reviewed and updated as appropriate.   Please see review of systems for further details on the patient's review from today.   Objective:   Physical Exam:  BP 120/80   Pulse 74   Physical  Exam Neurological:     Mental Status: He is alert and oriented to person, place, and time.     Cranial Nerves: No dysarthria.  Psychiatric:        Attention and Perception: Attention and perception normal.        Mood and Affect: Mood is anxious.        Speech: Speech normal.        Behavior: Behavior is cooperative.        Thought Content: Thought content normal. Thought content is not paranoid or delusional. Thought content does not include homicidal or suicidal ideation. Thought content does not include homicidal or suicidal plan.        Cognition and Memory: Cognition and memory normal.        Judgment: Judgment normal.  Comments: Insight intact     Lab Review:     Component Value Date/Time   NA 140 06/18/2019 1850   K 3.5 06/18/2019 1850   CL 102 06/18/2019 1850   CO2 28 06/18/2019 1850   GLUCOSE 93 06/18/2019 1850   BUN 16 06/18/2019 1850   CREATININE 0.88 06/18/2019 1850   CALCIUM 9.4 06/18/2019 1850   PROT 6.8 06/06/2019 0837   ALBUMIN 4.6 06/06/2019 0837   AST 20 06/06/2019 0837   ALT 16 06/06/2019 0837   ALKPHOS 38 (L) 06/06/2019 0837   BILITOT 0.6 06/06/2019 0837   GFRNONAA >60 06/18/2019 1850   GFRAA >60 06/18/2019 1850       Component Value Date/Time   WBC 6.6 06/18/2019 1850   RBC 4.81 06/18/2019 1850   HGB 13.8 06/18/2019 1850   HCT 42.1 06/18/2019 1850   PLT 278 06/18/2019 1850   MCV 87.5 06/18/2019 1850   MCH 28.7 06/18/2019 1850   MCHC 32.8 06/18/2019 1850   RDW 12.5 06/18/2019 1850   LYMPHSABS 1.4 06/06/2019 0837   MONOABS 0.4 06/06/2019 0837   EOSABS 0.1 06/06/2019 0837   BASOSABS 0.1 06/06/2019 0837    No results found for: POCLITH, LITHIUM   No results found for: PHENYTOIN, PHENOBARB, VALPROATE, CBMZ   .res Assessment: Plan:   Discussed potential benefits, risks, and side effects of Prozac. Pt agrees to trial of low dose Prozac. Will start Prozac 10 mg po qd for mood and anxiety.  Will decrease Buspar to 15 mg poQHS for one week,  then stop.  Continue Guanfacine 2 mg po QHS for ADHD. Discussed Guanfacine may also be helpful for anxiety, irritability and insomnia.  Discussed option of switching Adderall to Ritalin to potentially minimize irritability. Pt reports that he would prefer to continue Adderall at this time since benefits seem to outweigh side effects at this time . Pt to follow-up in 6 weeks or sooner if clinically indicated.  Patient advised to contact office with any questions, adverse effects, or acute worsening in signs and symptoms.   Craig Fletcher was seen today for anxiety and other.  Diagnoses and all orders for this visit:  Attention deficit hyperactivity disorder (ADHD), unspecified ADHD type -     guanFACINE (TENEX) 2 MG tablet; TAKE 1 TABLET BY MOUTH EVERYDAY AT BEDTIME -     amphetamine-dextroamphetamine (ADDERALL) 10 MG tablet; Take 1/2-1 tab po BID  Anxiety disorder, unspecified type -     FLUoxetine (PROZAC) 10 MG capsule; Take 1 capsule (10 mg total) by mouth daily. -     guanFACINE (TENEX) 2 MG tablet; TAKE 1 TABLET BY MOUTH EVERYDAY AT BEDTIME -     busPIRone (BUSPAR) 30 MG tablet; Take 0.5 tablets (15 mg total) by mouth at bedtime. Stop after 1 week     Please see After Visit Summary for patient specific instructions.  No future appointments.  No orders of the defined types were placed in this encounter.     -------------------------------

## 2020-05-06 ENCOUNTER — Telehealth: Payer: Self-pay | Admitting: Psychiatry

## 2020-05-06 DIAGNOSIS — F419 Anxiety disorder, unspecified: Secondary | ICD-10-CM

## 2020-05-08 NOTE — Telephone Encounter (Signed)
Please schedule appt

## 2020-05-09 NOTE — Telephone Encounter (Signed)
LM on VM follow up due.

## 2020-05-30 ENCOUNTER — Other Ambulatory Visit: Payer: 59

## 2020-06-15 ENCOUNTER — Other Ambulatory Visit: Payer: Self-pay | Admitting: Psychiatry

## 2020-06-15 DIAGNOSIS — F419 Anxiety disorder, unspecified: Secondary | ICD-10-CM

## 2020-06-17 NOTE — Telephone Encounter (Signed)
Have pt schedule f/u with Shanda Bumps

## 2020-06-19 NOTE — Telephone Encounter (Signed)
Next visit is 07/21/20.

## 2020-07-21 ENCOUNTER — Ambulatory Visit: Payer: 59 | Admitting: Psychiatry

## 2020-07-30 ENCOUNTER — Other Ambulatory Visit: Payer: Self-pay

## 2020-07-30 ENCOUNTER — Ambulatory Visit (INDEPENDENT_AMBULATORY_CARE_PROVIDER_SITE_OTHER): Payer: 59 | Admitting: Mental Health

## 2020-07-30 DIAGNOSIS — F4323 Adjustment disorder with mixed anxiety and depressed mood: Secondary | ICD-10-CM | POA: Diagnosis not present

## 2020-07-30 NOTE — Progress Notes (Signed)
[facility] Counselor/therapist psychotherapy note  Name: Craig Fletcher Date: 07/31/19  [id number] DOB: 28-Jan-1986 PCP: Emi Belfast, FNP (Inactive)  Time spent:  53 minutes   Treatment: individual therapy  Mental Status Exam:    Appearance:   casual  Behavior:  WNL  /Motor:  WNL  Speech/Language:   Clear and Coherent  Affect:  Full range  Mood:  depressed  Thought process:  normal  Thought content:    WNL  Sensory/Perceptual disturbances:    WNL  Orientation:  x4  Attention:  Good  Concentration:  Good  Memory:  WNL  Fund of knowledge:   Good  Insight:    Good  Judgment:   Good  Impulse Control:  Good   Reported Symptoms:  Anxious, irritability, distractible, sadness, distractible, some problems w/ focus  Risk Assessment: Danger to Self:  No Self-injurious Behavior: No Danger to Others: No Duty to Warn:no Physical Aggression / Violence:No  Access to Firearms a concern: No  Gang Involvement:No  Patient / guardian was educated about steps to take if suicide or homicide risk level increases between visits: yes While future psychiatric events cannot be accurately predicted, the patient does not currently require acute inpatient psychiatric care and does not currently meet [facility] involuntary commitment criteria.    Subjective:   Patient presents for session on time.  Events in progress were shared since last session which was just over a year ago.  He shared how he continues to have stress in his marriage, going on to share some details regarding the challenges with their communication.  He stated they continue to struggle with parenting their son, who is 60 years old.  The main focus was around mental as his son has been sleeping with him for the past several years, ways they have tried to communicate and get him to sleep in his room independently was shared.  He stated that he and his wife often have little quality time due to the evenings being centered  around their son and his need to have one of them sleep in his room.  Concept such as using positive reinforcement with her son in this area were explored.  Patient shared as 1 feels that he may have a problem drinking alcohol at times, he states that he does not drink daily and that she may tell him that he is slurring his words when he may have only had 1 drink.  He stated he followed up with going to the doctor where it was discussed that he may be coping with some dehydration and still is making an effort to drink fluids adequately which he feels he can improve as he may get busy and hours elapse.  He does not the need to be able to continue to work with his wife, not having disagreements particularly with her parenting their son as he feels this is not helpful and plans to be intentional about working on this between sessions.   Interventions: Communication strategies, supportive approaches, problem solving approaches  Diagnoses:  Adjustment disorder: AD/HD   Individualized Plan of Care:   1. Patient/family to engage in family psychotherapy 2-4 times a month or as needed.  2. Patient/family is to use communication, coping skills to improve family relationships.    3.  Patient will identify when he needs time to remove himself from a situation in which he may make comments that are hurtful.  Wife and patient will focus on active listening to further understand her husband's  intentions as opposed to reacting impulsively.  4. Patient/family to contact this office, go to the local ED or call 911 if a crisis or emergency develops between visits.  Progress: Progressing   [name], [facility] ([place])

## 2020-09-17 ENCOUNTER — Ambulatory Visit (INDEPENDENT_AMBULATORY_CARE_PROVIDER_SITE_OTHER): Payer: 59 | Admitting: Mental Health

## 2020-09-17 DIAGNOSIS — F4323 Adjustment disorder with mixed anxiety and depressed mood: Secondary | ICD-10-CM | POA: Diagnosis not present

## 2020-09-17 NOTE — Progress Notes (Signed)
[facility] Counselor/therapist psychotherapy note  Name: Craig Fletcher Date: 09/18/19  GYI:/[id number] DOB: [birth year] PCP: Emi Belfast, FNP  Time spent:  53 minutes   Treatment: individual therapy  Mental Status Exam:    Appearance:   casual  Behavior:  WNL  /Motor:  WNL  Speech/Language:   Clear and Coherent  Affect:  Full range  Mood:  depressed  Thought process:  normal  Thought content:    WNL  Sensory/Perceptual disturbances:    WNL  Orientation:  x4  Attention:  Good  Concentration:  Good  Memory:  WNL  Fund of knowledge:   Good  Insight:    Good  Judgment:   Good  Impulse Control:  Good   Reported Symptoms:  Anxious, irritability, distractible, sadness, distractible, some problems w/ focus  Risk Assessment: Danger to Self:  No Self-injurious Behavior: No Danger to Others: No Duty to Warn:no Physical Aggression / Violence:No  Access to Firearms a concern: No  Gang Involvement:No  Patient / guardian was educated about steps to take if suicide or homicide risk level increases between visits: yes While future psychiatric events cannot be accurately predicted, the patient does not currently require acute inpatient psychiatric care and does not currently meet [facility] involuntary commitment criteria.    Subjective:   Patient presents for session on time.  Patient shared how the family plans to take a trip to [place] this weekend.  He stated his son remains in counseling, had a session last Wednesday.  He shared how also he and his wife are "more on the same page with rules".  They are trying to be more consistent in their efforts in parenting their son.  He went on to share some of the ways with which they continue to have marital stress due to her feelings that can occur through arguments.  One instance where his communication occurred as he wanted his son and his friends that he had over to stay in his room and be able to watch TV, he stated his wife  was wanting them to be in their room, due to being able to watch a certain program longer to be thinking that this was not possible in the their son's bedroom.  Patient identified the need to further ask questions for clarity and discuss how he feels they both can benefit from this in their communication.  He identified often feeling "controlled" by his wife, feels that his thoughts and ideas were not validated, feeling "overwhelmed" often.  It was identified the need to have more quality time alone as a couple and he plans to identify ways with which this could work given their work schedules.   Interventions: Communication strategies, supportive approaches, problem solving approaches  Diagnoses:  Adjustment disorder: AD/HD   Individualized Plan of Care:   1. Patient/family to engage in family psychotherapy 2-4 times a month or as needed.  2. Patient/family is to use communication, coping skills to improve family relationships.    3.  Patient will identify when he needs time to remove himself from a situation in which he may make comments that are hurtful.  Wife and patient will focus on active listening to further understand her husband's intentions as opposed to reacting impulsively.  4. Patient/family to contact this office, go to the local ED or call 911 if a crisis or emergency develops between visits.  Progress: Progressing   [name], [facility]

## 2020-11-12 ENCOUNTER — Ambulatory Visit: Payer: 59 | Admitting: Mental Health

## 2020-11-12 ENCOUNTER — Telehealth: Payer: Self-pay | Admitting: Mental Health

## 2020-11-12 NOTE — Telephone Encounter (Signed)
Pt's wife, Morrie Sheldon called was adamant that she needs to be worked in for their marriage counseling appt. Their original appt was for today but since you are out sick, she's asked to see you before 10/28 (which was the first Wed appt I found at the time). She needs a Wed.

## 2020-11-18 ENCOUNTER — Ambulatory Visit: Payer: 59 | Admitting: Addiction (Substance Use Disorder)

## 2020-11-21 ENCOUNTER — Other Ambulatory Visit (HOSPITAL_COMMUNITY): Payer: Self-pay | Admitting: Internal Medicine

## 2020-11-21 DIAGNOSIS — R4781 Slurred speech: Secondary | ICD-10-CM

## 2020-11-22 ENCOUNTER — Ambulatory Visit (HOSPITAL_COMMUNITY)
Admission: RE | Admit: 2020-11-22 | Discharge: 2020-11-22 | Disposition: A | Discharge status: Home / Self Care | Payer: 59 | Source: Ambulatory Visit | Attending: Internal Medicine | Admitting: Internal Medicine

## 2020-11-22 ENCOUNTER — Other Ambulatory Visit: Payer: Self-pay

## 2020-11-22 DIAGNOSIS — R4781 Slurred speech: Secondary | ICD-10-CM | POA: Insufficient documentation

## 2020-11-22 MED ORDER — GADOBUTROL 1 MMOL/ML IV SOLN
10.0000 mL | Freq: Once | INTRAVENOUS | Status: AC | PRN
  Administered 2020-11-22: 10 mL via INTRAVENOUS

## 2020-11-24 IMAGING — CR DG CHEST 2V
1 series · 2 of 2 positions shown · non-contrast
Comparison: Chest radiograph dated 06/03/2019.

CLINICAL DATA: 33-year-old female with chest pain.

EXAM:
CHEST - 2 VIEW

[Series 1: dg chest 2 view · 0.14mm/px · 2 of 2 slices shown]
[im 1/2]
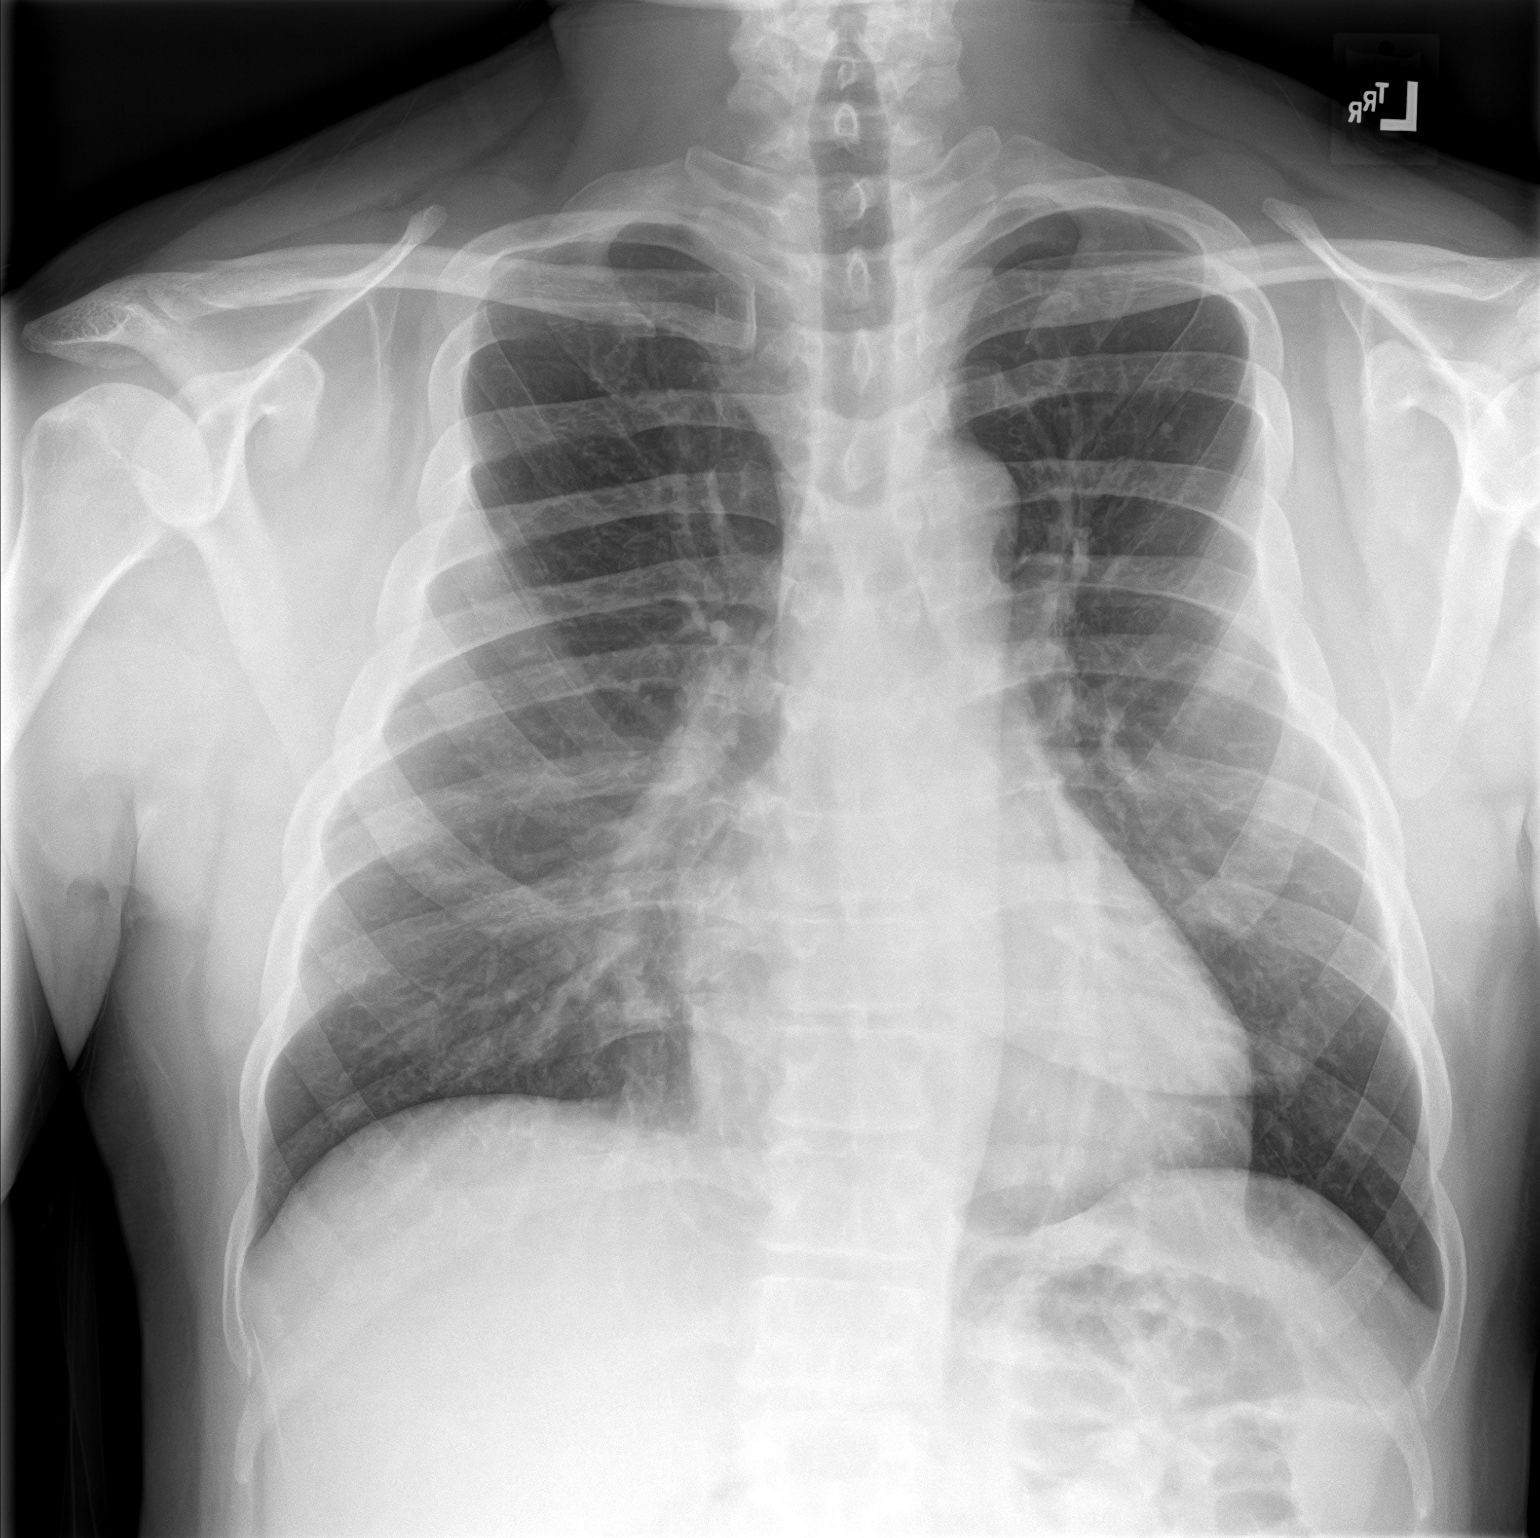
[im 2/2]
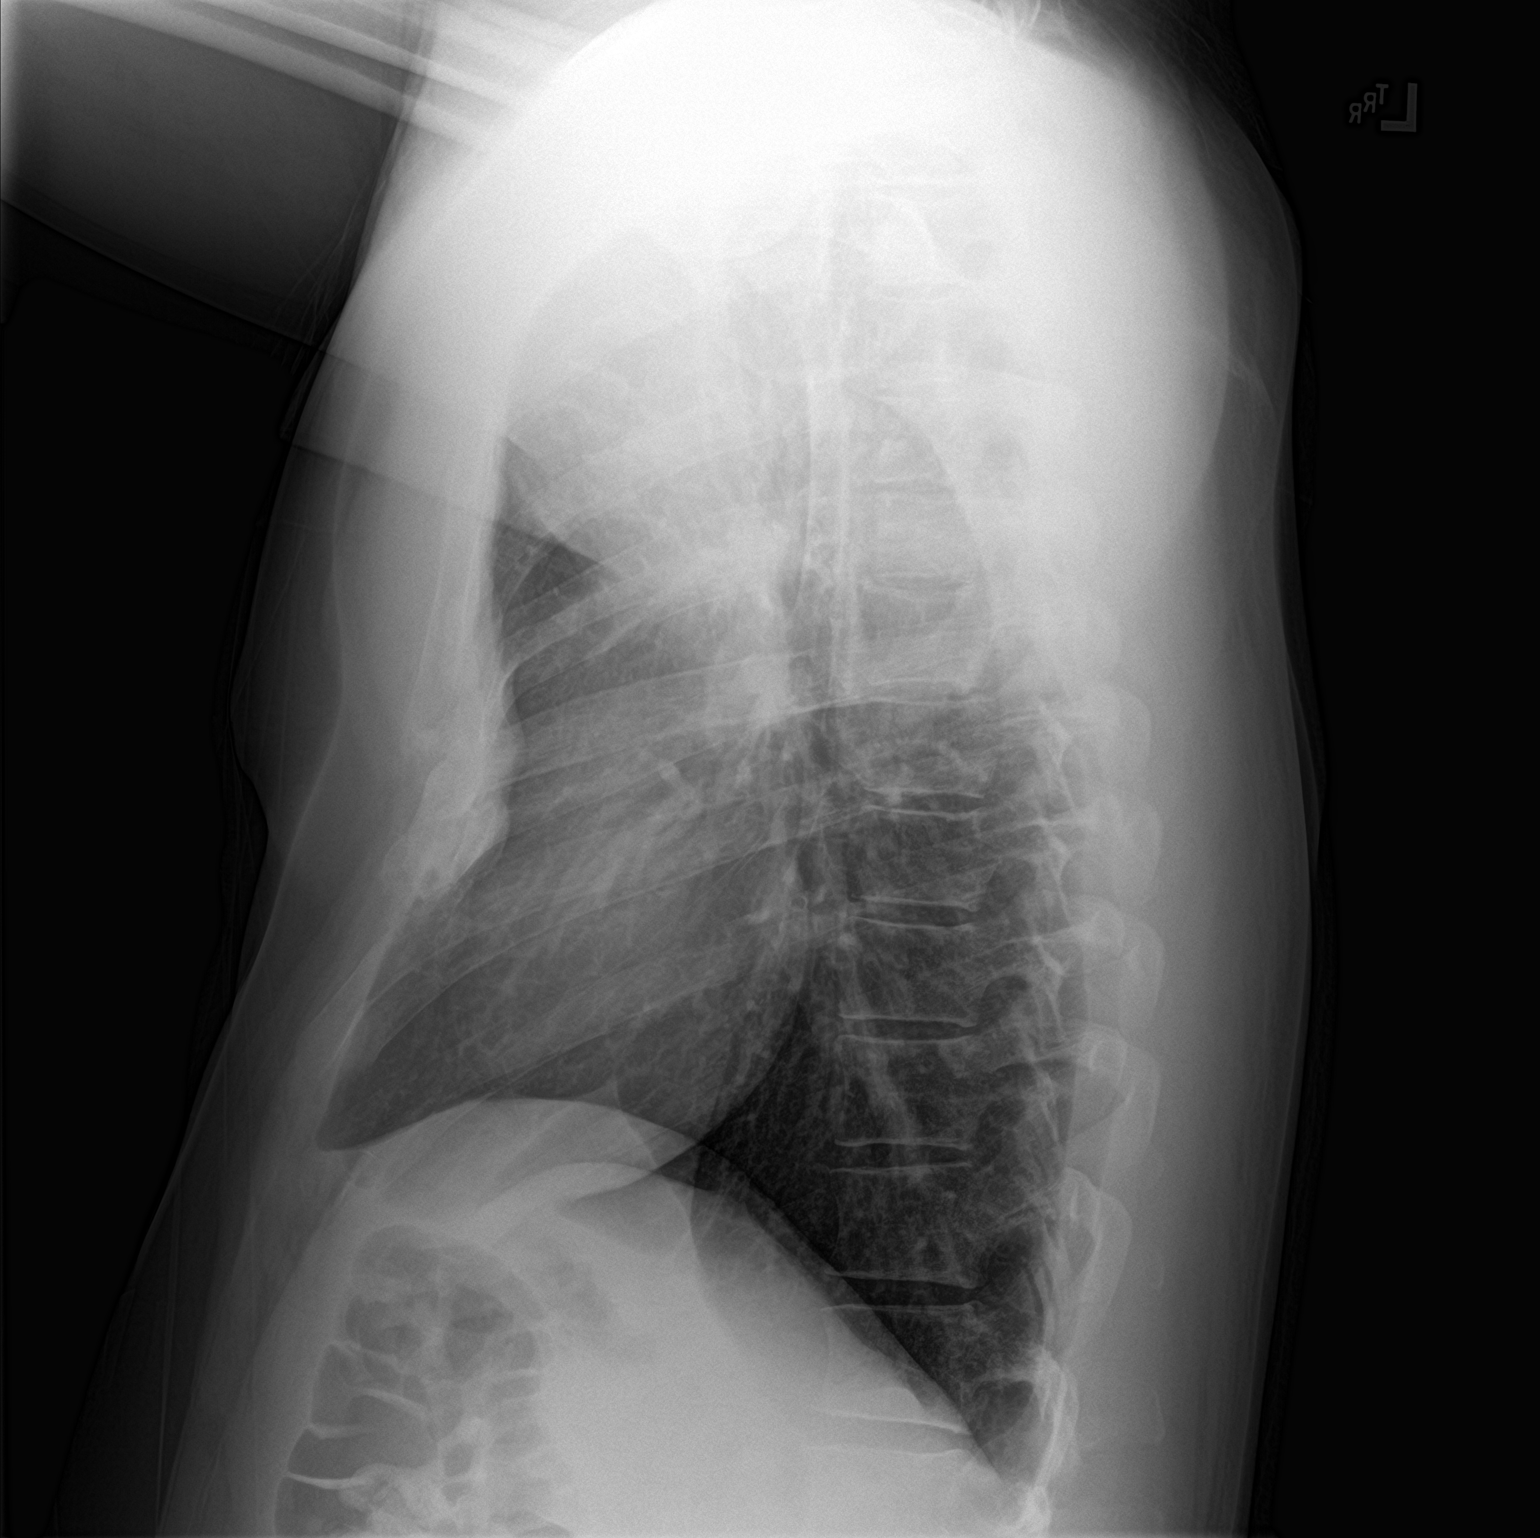

[2 of 2 positions shown; findings below may reference images not displayed]

FINDINGS: There is no focal consolidation, pleural effusion or pneumothorax.
Stable cardiomediastinal silhouette. No acute osseous pathology.
Pectus excavatum.
IMPRESSION: No active cardiopulmonary disease.

## 2020-11-26 ENCOUNTER — Ambulatory Visit: Payer: 59 | Admitting: Mental Health

## 2021-01-29 DIAGNOSIS — H93292 Other abnormal auditory perceptions, left ear: Secondary | ICD-10-CM | POA: Insufficient documentation

## 2021-01-29 DIAGNOSIS — J352 Hypertrophy of adenoids: Secondary | ICD-10-CM | POA: Insufficient documentation

## 2021-01-29 DIAGNOSIS — G478 Other sleep disorders: Secondary | ICD-10-CM | POA: Insufficient documentation

## 2021-02-13 ENCOUNTER — Emergency Department (HOSPITAL_BASED_OUTPATIENT_CLINIC_OR_DEPARTMENT_OTHER)
Admission: EM | Admit: 2021-02-13 | Discharge: 2021-02-13 | Disposition: A | Discharge status: Home / Self Care | Payer: 59 | Attending: Emergency Medicine | Admitting: Emergency Medicine

## 2021-02-13 ENCOUNTER — Other Ambulatory Visit: Payer: Self-pay

## 2021-02-13 ENCOUNTER — Emergency Department (HOSPITAL_BASED_OUTPATIENT_CLINIC_OR_DEPARTMENT_OTHER): Payer: 59

## 2021-02-13 ENCOUNTER — Encounter (HOSPITAL_BASED_OUTPATIENT_CLINIC_OR_DEPARTMENT_OTHER): Payer: Self-pay | Admitting: Emergency Medicine

## 2021-02-13 DIAGNOSIS — R9431 Abnormal electrocardiogram [ECG] [EKG]: Secondary | ICD-10-CM | POA: Insufficient documentation

## 2021-02-13 DIAGNOSIS — R479 Unspecified speech disturbances: Secondary | ICD-10-CM

## 2021-02-13 DIAGNOSIS — F419 Anxiety disorder, unspecified: Secondary | ICD-10-CM | POA: Insufficient documentation

## 2021-02-13 DIAGNOSIS — R7309 Other abnormal glucose: Secondary | ICD-10-CM | POA: Insufficient documentation

## 2021-02-13 LAB — CBC WITH DIFFERENTIAL/PLATELET
Abs Immature Granulocytes: 0.01 10*3/uL (ref 0.00–0.07)
Basophils Absolute: 0 10*3/uL (ref 0.0–0.1)
Basophils Relative: 1 %
Eosinophils Absolute: 0.1 10*3/uL (ref 0.0–0.5)
Eosinophils Relative: 1 %
HCT: 40.2 % (ref 39.0–52.0)
Hemoglobin: 13.6 g/dL (ref 13.0–17.0)
Immature Granulocytes: 0 %
Lymphocytes Relative: 46 %
Lymphs Abs: 2.1 10*3/uL (ref 0.7–4.0)
MCH: 29.4 pg (ref 26.0–34.0)
MCHC: 33.8 g/dL (ref 30.0–36.0)
MCV: 86.8 fL (ref 80.0–100.0)
Monocytes Absolute: 0.4 10*3/uL (ref 0.1–1.0)
Monocytes Relative: 9 %
Neutro Abs: 2 10*3/uL (ref 1.7–7.7)
Neutrophils Relative %: 43 %
Platelets: 308 10*3/uL (ref 150–400)
RBC: 4.63 MIL/uL (ref 4.22–5.81)
RDW: 13.3 % (ref 11.5–15.5)
WBC: 4.6 10*3/uL (ref 4.0–10.5)
nRBC: 0 % (ref 0.0–0.2)

## 2021-02-13 LAB — RAPID URINE DRUG SCREEN, HOSP PERFORMED
Amphetamines: NOT DETECTED
Barbiturates: NOT DETECTED
Benzodiazepines: NOT DETECTED
Cocaine: NOT DETECTED
Opiates: NOT DETECTED
Tetrahydrocannabinol: NOT DETECTED

## 2021-02-13 LAB — COMPREHENSIVE METABOLIC PANEL
ALT: 33 U/L (ref 0–44)
AST: 30 U/L (ref 15–41)
Albumin: 4.4 g/dL (ref 3.5–5.0)
Alkaline Phosphatase: 42 U/L (ref 38–126)
Anion gap: 10 (ref 5–15)
BUN: 14 mg/dL (ref 6–20)
CO2: 25 mmol/L (ref 22–32)
Calcium: 8.7 mg/dL — ABNORMAL LOW (ref 8.9–10.3)
Chloride: 102 mmol/L (ref 98–111)
Creatinine, Ser: 0.9 mg/dL (ref 0.61–1.24)
GFR, Estimated: >60 mL/min (ref >60)
Glucose, Bld: 94 mg/dL (ref 70–99)
Potassium: 3.9 mmol/L (ref 3.5–5.1)
Sodium: 137 mmol/L (ref 135–145)
Total Bilirubin: 0.5 mg/dL (ref 0.3–1.2)
Total Protein: 7.4 g/dL (ref 6.5–8.1)

## 2021-02-13 LAB — CBG MONITORING, ED: Glucose-Capillary: 89 mg/dL (ref 70–99)

## 2021-02-13 NOTE — ED Triage Notes (Addendum)
Family reports slurred speech since 3 pm. Also report stubbling while walking. Denies n/v, numbness, dizziness, weakness, head/neck trauma. Reports two sips of wine earlier today. Hx of same episode.

## 2021-02-13 NOTE — ED Notes (Signed)
Patient discharged to home.  All discharge instructions reviewed.  Patient verbalized understanding via teachback method.  VS WDL.  Respirations even and unlabored.  Ambulatory out of ED.   °

## 2021-02-13 NOTE — ED Provider Notes (Signed)
[facility] Provider Note   CSN: [id number] Arrival date & time: 02/13/21  1826     History Chief Complaint  Patient presents with   Aphasia    Craig Fletcher is a 35 y.o. male.  This is a 35 y.o. male with significant medical history as below, including ADHD, anxiety who presents to the ED with complaint of difficulty speaking.   Pt to ED with report of slurred speech.  Symptoms have been intermittent over the past 6 months.  He has been evaluated by MRI in the past which did not show evidence of acute CVA.  Symptoms at times are associated with exertion, hot environments, anxiousness.  Last reports symptoms approximately 1 PM this afternoon following ETOH consumption and sitting in the hot tub for, resolved spontaneously.  Symptoms associated with being emotionally upset.  He is currently symptomatic.  Similar as prior to previous episodes.  He has received MRI previously.  Has not yet been evaluated by neurology.  No numbness, tingling, difficulty speaking, recent falls, medication changes, illicit drug use.   The history is provided by the spouse and the patient. No language interpreter was used.      Past Medical History:  Diagnosis Date   ADHD    Anxiety     Patient Active Problem List   Diagnosis Date Noted   Acute sinusitis 02/13/2016   Adjustment disorder with mixed anxiety and depressed mood 06/09/2015   Lymphadenitis 04/16/2015   Fatigue 09/30/2014   ADD (attention deficit disorder) 08/02/2012    Past Surgical History:  Procedure Laterality Date   CHEST WALL RECONSTRUCTION         Family History  Problem Relation Age of Onset   Depression Mother    Alcohol abuse Father     Social History   Tobacco Use   Smoking status: Never   Smokeless tobacco: Never  Substance Use Topics   Alcohol use: Yes    Comment: rare/occ   Drug use: No    Home Medications Prior to Admission medications   Medication Sig Start Date End  Date Taking? Authorizing Provider  FLUoxetine (PROZAC) 10 MG capsule TAKE 1 CAPSULE BY MOUTH EVERY DAY 06/19/20  Yes Thayer Headings, PMHNP  melatonin 5 MG TABS Take 5 mg by mouth at bedtime as needed.   Yes [provider]  amphetamine-dextroamphetamine (ADDERALL) 10 MG tablet Take 1/2-1 tab po BID 03/06/20   Thayer Headings, PMHNP  busPIRone (BUSPAR) 30 MG tablet Take 0.5 tablets (15 mg total) by mouth at bedtime. Stop after 1 week 03/06/20   Thayer Headings, PMHNP  cholecalciferol (VITAMIN D3) 25 MCG (1000 UNIT) tablet Take 1,000 Units by mouth daily.    [provider]  famotidine (PEPCID) 20 MG tablet Take 1 tablet (20 mg total) by mouth at bedtime. Patient taking differently: Take 20 mg by mouth daily as needed. 06/01/19   Elby Beck, FNP  guanFACINE (TENEX) 2 MG tablet TAKE 1 TABLET BY MOUTH EVERYDAY AT BEDTIME 03/06/20   Thayer Headings, PMHNP  ibuprofen (ADVIL) 800 MG tablet Take 1 tablet (800 mg total) by mouth every 8 (eight) hours as needed. 02/07/19   Elby Beck, FNP  vitamin B-12 (CYANOCOBALAMIN) 1000 MCG tablet Take 1,000 mcg by mouth daily.    [provider]    Allergies    Patient has no known allergies.  Review of Systems   Review of Systems  Constitutional:  Negative for chills and fever.  HENT:  Negative for facial swelling and trouble swallowing.   Eyes:  Negative for photophobia and visual disturbance.  Respiratory:  Negative for cough and shortness of breath.   Cardiovascular:  Negative for chest pain and palpitations.  Gastrointestinal:  Negative for abdominal pain, nausea and vomiting.  Endocrine: Negative for polydipsia and polyuria.  Genitourinary:  Negative for difficulty urinating and hematuria.  Musculoskeletal:  Negative for gait problem and joint swelling.  Skin:  Negative for pallor and rash.  Neurological:  Positive for speech difficulty. Negative for syncope and headaches.  Psychiatric/Behavioral:  Negative for  agitation and confusion.    Physical Exam Updated Vital Signs BP 113/76 (BP Location: Right Arm)    Pulse 63    Temp 98 F (36.7 C) (Oral)    Resp 14    Ht 6' (1.829 m)    Wt 97.5 kg    SpO2 99%    BMI 29.15 kg/m   Physical Exam Vitals and nursing note reviewed.  Constitutional:      General: He is not in acute distress.    Appearance: Normal appearance. He is well-developed. He is not ill-appearing, toxic-appearing or diaphoretic.  HENT:     Head: Normocephalic and atraumatic.     Right Ear: External ear normal.     Left Ear: External ear normal.     Mouth/Throat:     Mouth: Mucous membranes are moist.  Eyes:     General: No scleral icterus.    Extraocular Movements: Extraocular movements intact.     Pupils: Pupils are equal, round, and reactive to light.  Cardiovascular:     Rate and Rhythm: Normal rate and regular rhythm.     Pulses: Normal pulses.     Heart sounds: Normal heart sounds.  Pulmonary:     Effort: Pulmonary effort is normal. No respiratory distress.     Breath sounds: Normal breath sounds.  Abdominal:     General: Abdomen is flat. There is no distension.     Palpations: Abdomen is soft.     Tenderness: There is no abdominal tenderness.  Musculoskeletal:        General: Normal range of motion.     Cervical back: Full passive range of motion without pain and normal range of motion.     Right lower leg: No edema.     Left lower leg: No edema.  Skin:    General: Skin is warm and dry.     Capillary Refill: Capillary refill takes less than 2 seconds.  Neurological:     General: No focal deficit present.     Mental Status: He is alert and oriented to person, place, and time. Mental status is at baseline.     GCS: GCS eye subscore is 4. GCS verbal subscore is 5. GCS motor subscore is 6.     Cranial Nerves: Cranial nerves 2-12 are intact. No facial asymmetry.     Sensory: Sensation is intact. No sensory deficit.     Motor: Motor function is intact. No weakness,  tremor or pronator drift.     Coordination: Coordination is intact. Coordination normal. Finger-Nose-Finger Test normal.     Gait: Gait is intact. Gait normal.  Psychiatric:        Mood and Affect: Mood normal. Affect is tearful.        Behavior: Behavior normal.    ED Results / Procedures / Treatments   Labs (all labs ordered are listed, but only abnormal results are displayed) Labs Reviewed  COMPREHENSIVE  METABOLIC PANEL - Abnormal; Notable for the following components:      Result Value   Calcium 8.7 (*)    All other components within normal limits  CBC WITH DIFFERENTIAL/PLATELET  RAPID URINE DRUG SCREEN, HOSP PERFORMED  CBG MONITORING, ED    EKG EKG Interpretation  Date/Time:  Friday February 13 2021 21:02:57 EST Ventricular Rate:  63 PR Interval:  177 QRS Duration: 108 QT Interval:  419 QTC Calculation: 429 R Axis:   88 Text Interpretation: Sinus rhythm Probable left atrial enlargement RSR' in V1 or V2, probably normal variant Nonspecific T abnrm, anterolateral leads ST elev, probable normal early repol pattern similar to prior 4/21 Confirmed by Wynona Dove ([id number]) on 02/13/2021 9:48:21 PM  Radiology CT Head Wo Contrast  Result Date: 02/13/2021 CLINICAL DATA:  Delirium, slurred speech EXAM: CT HEAD WITHOUT CONTRAST TECHNIQUE: Contiguous axial images were obtained from the base of the skull through the vertex without intravenous contrast. COMPARISON:  No prior CT, correlation is made with MRI head 11/22/2020. FINDINGS: Brain: No evidence of acute infarction, hemorrhage, cerebral edema, mass, mass effect, or midline shift. No hydrocephalus or extra-axial fluid collection. Vascular: No hyperdense vessel. Skull: Normal. Negative for fracture or focal lesion. Sinuses/Orbits: Mucous retention cysts in the right inferior frontal sinus/anterior ethmoid air cells. The orbits are unremarkable. Other: The mastoid air cells are well aerated. IMPRESSION: IMPRESSION No acute intracranial  process. Electronically Signed   By: Merilyn Baba M.D.   On: 02/13/2021 19:36    Procedures Procedures   Medications Ordered in ED Medications - No data to display  ED Course  I have reviewed the triage vital signs and the nursing notes.  Pertinent labs & imaging results that were available during my care of the patient were reviewed by me and considered in my medical decision making (see chart for details).    MDM Rules/Calculators/A&P                          CC: speech problem  This patient complains of above; this involves an extensive number of treatment options and is a complaint that carries with it a high risk of complications and morbidity. Vital signs were reviewed. Serious etiologies considered.  Currently asymptomatic, NIHSS 0, suspicion for acute neurologic abnormality is low at this time.  Record review:  Previous records obtained and reviewed   Additional history obtained from spouse  Work up as above, notable for:  Labs & imaging results that were available during my care of the patient were reviewed by me and considered in my medical decision making.   I ordered imaging studies which included CTH and I independently visualized and interpreted imaging which showed no acute process    Reassessment:  Patient has received MRI in the past for similar complaints.  I do not see need to repeat MRI testing at this time.  Recommend pt follow up with neurology, referral ordered.  Advised patient to refrain from alcohol use in the future. Favor some component of anxiety is etiology of patient's symptoms in addition to alcohol use.  He remains neurologically intact, no recurrent symptoms.  He is Dealer with a steady gait.  Reports he is feeling better, he is less upset than he was upon arrival.  The patient improved significantly and was discharged in stable condition. Detailed discussions were had with the patient regarding current findings, and need for close f/u  with PCP or on call doctor. The patient has  been instructed to return immediately if the symptoms worsen in any way for re-evaluation. Patient verbalized understanding and is in agreement with current care plan. All questions answered prior to discharge.           This chart was dictated using voice recognition software.  Despite best efforts to proofread,  errors can occur which can change the documentation meaning.    Final Clinical Impression(s) / ED Diagnoses Final diagnoses:  Difficulty speaking  Anxiousness    Rx / DC Orders ED Discharge Orders          Ordered    Ambulatory referral to Neurology       Comments: An appointment is requested in approximately: 1 week   02/13/21 2143             Sloan Leiter, DO 02/14/21 0010

## 2021-02-13 NOTE — Discharge Instructions (Addendum)
Please return to the ER for any worsening or worrisome symptoms.  Follow up with neurology regarding speech difficulty

## 2021-02-19 ENCOUNTER — Ambulatory Visit: Payer: 59 | Admitting: Neurology

## 2021-02-19 ENCOUNTER — Encounter: Payer: Self-pay | Admitting: Neurology

## 2021-02-19 ENCOUNTER — Other Ambulatory Visit: Payer: Self-pay

## 2021-02-19 VITALS — BP 127/84 | HR 72 | Ht 72.0 in | Wt 231.5 lb

## 2021-02-19 DIAGNOSIS — R4781 Slurred speech: Secondary | ICD-10-CM | POA: Diagnosis not present

## 2021-02-19 DIAGNOSIS — J341 Cyst and mucocele of nose and nasal sinus: Secondary | ICD-10-CM | POA: Insufficient documentation

## 2021-02-19 DIAGNOSIS — R930 Abnormal findings on diagnostic imaging of skull and head, not elsewhere classified: Secondary | ICD-10-CM | POA: Insufficient documentation

## 2021-02-19 DIAGNOSIS — R4182 Altered mental status, unspecified: Secondary | ICD-10-CM

## 2021-02-19 DIAGNOSIS — Z0000 Encounter for general adult medical examination without abnormal findings: Secondary | ICD-10-CM | POA: Insufficient documentation

## 2021-02-19 DIAGNOSIS — R4701 Aphasia: Secondary | ICD-10-CM | POA: Insufficient documentation

## 2021-02-19 NOTE — Patient Instructions (Signed)
Routine EEG  Follow up with your doctor and return if worse

## 2021-02-19 NOTE — Progress Notes (Signed)
[facility]  PATIENT: Craig Fletcher DOB: Feb 07, 1986  REQUESTING CLINICIAN: Sloan Leiter, DO HISTORY FROM: Patient and wife  REASON FOR VISIT: Episode of slurred speech   HISTORICAL  CHIEF COMPLAINT:  Chief Complaint  Patient presents with   New Patient (Initial Visit)    Rm 13. Accompanied by wife. NP/Paper proficient/Craig Timothy Lasso MD 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  Per wife, the first episode happened about 2-1/2 years ago while [place] the beach and possibly had some alcohol.  They related it to alcohol intake.  Since then patient has about 6 episodes of slurred speech, during this time, he is aware, wife noted that he is a little argumentative and irritable and his speech is slurred, he is able to comprehend, and follow direction.  The last episode was last Friday and lasted about 5 hours.  It took about 4 people to convince him to go to the ED and he initially refused to go.  He has been seen previously in the emergency department, had Brain MRI which was negative for any acute stroke. In his last presentation he also had a head CT which was negative for acute stroke.  Most of the episodes are associated with alcohol intake but patient reported taking only one sip of alcohol but wife noted there was maybe 1 or 2 episodes that were not associated with slurred with alcohol intake that they attributed to dehydration.   He is a IT sales professional, denies any previous history of TBI, no previous history of seizures, no family history of seizures but his family history of alcohol abuse.  Wife also reports trouble with memory, trouble with focusing, he has been cutting down on alcohol and energy drinks.  He carries a diagnosis of ADHD and he is on Adderall.   OTHER MEDICAL CONDITIONS: Anxiety,  ADHD    REVIEW OF SYSTEMS: Full 14 system review of systems performed and negative with exception of: as ntoed in the HPI   ALLERGIES: No Known Allergies  HOME MEDICATIONS: Outpatient Medications Prior to Visit  Medication Sig Dispense Refill   cholecalciferol (VITAMIN D3) 25 MCG (1000 UNIT) tablet Take 1,000 Units by mouth daily.     famotidine (PEPCID) 20 MG tablet Take 1 tablet (20 mg total) by mouth at bedtime. (Patient taking differently: Take 20 mg by mouth daily as needed.) 30 tablet 5   FLUoxetine (PROZAC) 10 MG capsule TAKE 1 CAPSULE BY MOUTH EVERY DAY 30 capsule 0   ibuprofen (ADVIL) 800 MG tablet Take 1 tablet (800 mg total) by mouth every 8 (eight) hours as needed. 30 tablet 0   melatonin 5 MG TABS Take 5 mg by mouth at bedtime as needed.     vitamin B-12 (CYANOCOBALAMIN) 1000 MCG tablet Take 1,000 mcg by mouth daily.     amphetamine-dextroamphetamine (ADDERALL) 10 MG tablet Take 1/2-1 tab po BID 60 tablet 0   busPIRone (BUSPAR) 30 MG tablet Take 0.5 tablets (15 mg total) by mouth at bedtime. Stop after 1 week 60 tablet 0   guanFACINE (TENEX) 2 MG tablet TAKE 1 TABLET BY MOUTH EVERYDAY AT BEDTIME 30 tablet 1   No facility-administered medications prior to visit.    PAST MEDICAL HISTORY: Past Medical History:  Diagnosis Date   ADHD    Anxiety     PAST  SURGICAL HISTORY: Past Surgical History:  Procedure Laterality Date   CHEST WALL RECONSTRUCTION      FAMILY HISTORY: Family History  Problem Relation Age of Onset   Depression Mother    Alcohol abuse Father     SOCIAL HISTORY: Social History   Socioeconomic History   Marital status: Married    Spouse name: Not on file   Number of children: Not on file   Years of education: Not on file   Highest education level: Not on file  Occupational History   Not on file  Tobacco Use   Smoking status: Never   Smokeless tobacco: Never  Substance and Sexual Activity   Alcohol use: Yes    Comment: rare/occ   Drug  use: No   Sexual activity: Not on file  Other Topics Concern   Not on file  Social History Narrative   Not on file   Social Determinants of Health   Financial Resource Strain: Not on file  Food Insecurity: Not on file  Transportation Needs: Not on file  Physical Activity: Not on file  Stress: Not on file  Social Connections: Not on file  Intimate Partner Violence: Not on file    PHYSICAL EXAM  GENERAL EXAM/CONSTITUTIONAL: Vitals:  Vitals:   02/19/21 0808  BP: 127/84  Pulse: 72  Weight: 231 lb 8 oz (105 kg)  Height: 6' (1.829 m)   Body mass index is 31.4 kg/m. Wt Readings from Last 3 Encounters:  02/19/21 231 lb 8 oz (105 kg)  02/13/21 214 lb 15.2 oz (97.5 kg)  06/18/19 215 lb (97.5 kg)   Patient is in no distress; well developed, nourished and groomed; neck is supple  CARDIOVASCULAR: Examination of carotid arteries is normal; no carotid bruits Regular rate and rhythm, no murmurs Examination of peripheral vascular system by observation and palpation is normal  EYES: Pupils round and reactive to light, Visual fields full to confrontation, Extraocular movements intacts,   MUSCULOSKELETAL: Gait, strength, tone, movements noted in Neurologic exam below  NEUROLOGIC: MENTAL STATUS:  No flowsheet data found. awake, alert, oriented to person, place and time recent and remote memory intact normal attention and concentration language fluent, comprehension intact, naming intact fund of knowledge appropriate  CRANIAL NERVE:  2nd, 3rd, 4th, 6th - pupils equal and reactive to light, visual fields full to confrontation, extraocular muscles intact, no nystagmus 5th - facial sensation symmetric 7th - facial strength symmetric 8th - hearing intact 9th - palate elevates symmetrically, uvula midline 11th - shoulder shrug symmetric 12th - tongue protrusion midline  MOTOR:  normal bulk and tone, full strength in the BUE, BLE  SENSORY:  normal and symmetric to light  touch, pinprick, temperature, vibration  COORDINATION:  finger-nose-finger, fine finger movements normal  REFLEXES:  deep tendon reflexes present and symmetric  GAIT/STATION:  normal     DIAGNOSTIC DATA (LABS, IMAGING, TESTING) - I reviewed patient records, labs, notes, testing and imaging myself where available.  Lab Results  Component Value Date   WBC 4.6 02/13/2021   HGB 13.6 02/13/2021   HCT 40.2 02/13/2021   MCV 86.8 02/13/2021   PLT 308 02/13/2021      Component Value Date/Time   NA 137 02/13/2021 2053   K 3.9 02/13/2021 2053   CL 102 02/13/2021 2053   CO2 25 02/13/2021 2053   GLUCOSE 94 02/13/2021 2053   BUN 14 02/13/2021 2053   CREATININE 0.90 02/13/2021 2053   CALCIUM 8.7 (L) 02/13/2021 2053   PROT 7.4  02/13/2021 2053   ALBUMIN 4.4 02/13/2021 2053   AST 30 02/13/2021 2053   ALT 33 02/13/2021 2053   ALKPHOS 42 02/13/2021 2053   BILITOT 0.5 02/13/2021 2053   GFRNONAA >60 02/13/2021 2053   GFRAA >60 06/18/2019 1850   No results found for: CHOL, HDL, LDLCALC, LDLDIRECT, TRIG, CHOLHDL No results found for: [id number] No results found for: VITAMINB12 Lab Results  Component Value Date   TSH 2.38 11/14/2017    MRI Brain 11/23/2019 No evidence of acute intracranial abnormality Partially empty sella turcica, a nonspecific finding. Otherwise unremarkable MRI appearance of the brain. Paranasal sinus disease, as described. Left mastoid effusion. Trace fluid also present within the right mastoid air cells. Nonspecific prominence of the nasopharyngeal/adenoid soft tissues. ENT consultation and direct visualization recommended.   Head CT 02/13/2021 No acute intracranial process.   ASSESSMENT AND PLAN  36 y.o. year old male with past medical history of anxiety and ADHD who is presenting with multiple episodes of slurred speech lasting hours most of the time associated with alcohol intake, there ire also episodes that are not associated with alcohol intake.  He  did have MRI and CT head which was negative for acute stroke.  During these episodes also wife also noted irritability and patient being argumentative which is not his personality.  I think is reasonable to rule him out for seizures, I will obtain the EEG and contact the patient to go over the result.  If the EEG is normal, I advised patient and wife to contact me when he has another episode.  At that time I will order an ambulatory EEG.  If the routine EEG is abnormal then I will bring him back to discuss further plans.  Follow-up with your primary care doctor and return if worse.   1. Slurred speech   2. Altered mental status, unspecified altered mental status type      Patient Instructions  Routine EEG  Follow up with your doctor and return if worse   Orders Placed This Encounter  Procedures   EEG adult    No orders of the defined types were placed in this encounter.   Return if symptoms worsen or fail to improve.    Windell NorfolkAmadou Edwen Mclester, MD 02/19/2021, 9:25 AM  [facility] [address], [address] [place], [place] [id number] [phone]

## 2021-02-24 ENCOUNTER — Other Ambulatory Visit: Payer: Self-pay | Admitting: Psychiatry

## 2021-02-24 DIAGNOSIS — F419 Anxiety disorder, unspecified: Secondary | ICD-10-CM

## 2021-02-24 NOTE — Telephone Encounter (Signed)
Please schedule appt

## 2021-02-24 NOTE — Telephone Encounter (Signed)
Last seen 03/06/20 due back 04/17/20

## 2021-02-27 NOTE — Telephone Encounter (Signed)
Patient will call next week  to schedule follow up.

## 2021-03-04 ENCOUNTER — Ambulatory Visit: Payer: 59 | Admitting: Neurology

## 2021-03-04 DIAGNOSIS — R4182 Altered mental status, unspecified: Secondary | ICD-10-CM

## 2021-03-04 DIAGNOSIS — R4781 Slurred speech: Secondary | ICD-10-CM

## 2021-03-06 ENCOUNTER — Telehealth: Payer: Self-pay | Admitting: Neurology

## 2021-03-06 NOTE — Telephone Encounter (Signed)
Pt called wanting to know when he will be receiving his EEG Results. Please advise.

## 2021-03-09 NOTE — Procedures (Signed)
° ° °  History:  50 yof with episode of slurred speech   EEG classification: Awake and drowsy  Description of the recording: The background rhythms of this recording consists of a fairly well modulated medium amplitude alpha rhythm of 8-9 Hz that is reactive to eye opening and closure. As the record progresses, the patient appears to remain in the waking state throughout the recording. Photic stimulation was performed, did not show any abnormalities. Hyperventilation was also performed, did not show any abnormalities. Toward the end of the recording, the patient enters the drowsy state with slight symmetric slowing seen. The patient never enters stage II sleep. No abnormal epileptiform discharges seen during this recording. There was no focal slowing. EKG monitor shows no evidence of cardiac rhythm abnormalities with a heart rate of 72.  Impression: This is a normal EEG recording in the waking and drowsy state. No evidence of interictal epileptiform discharges seen. A normal EEG does not exclude a diagnosis of epilepsy.    Windell Norfolk, MD [facility]

## 2021-03-10 ENCOUNTER — Telehealth: Payer: Self-pay | Admitting: Neurology

## 2021-03-10 ENCOUNTER — Other Ambulatory Visit: Payer: Self-pay | Admitting: Neurology

## 2021-03-10 DIAGNOSIS — R4182 Altered mental status, unspecified: Secondary | ICD-10-CM

## 2021-03-10 NOTE — Telephone Encounter (Signed)
Per Dr. Jabier Mutton EEG result note for patient: "This is a normal EEG recording in the waking and drowsy state. No evidence of interictal epileptiform discharges seen. A normal EEG does not exclude a diagnosis of epilepsy."  I called patient's wife, Caryl Pina, per DPR, to discuss. No answer, left a message asking her to call us back.

## 2021-03-10 NOTE — Telephone Encounter (Signed)
Patient's wife, Morrie Sheldon, per DPR returned my call.  I advised her that Dr. Teresa Coombs has ordered a 48-hour ambulatory EEG.  [facility] will be reaching out to them in the next week or 2 to schedule his EEG.  Patient's wife verbalized understanding and appreciation.

## 2021-03-10 NOTE — Telephone Encounter (Signed)
Please call and inform patient that we will proceed with a 48 hrs amb EEG.   Thank you

## 2021-03-10 NOTE — Telephone Encounter (Signed)
Pt's wife, Craig Fletcher (on [place]) inquiring about at home EEG. Would like a call from the nurse.

## 2021-03-10 NOTE — Telephone Encounter (Signed)
I called patient to discuss. No answer, left a message asking patient to call us back. 

## 2021-03-10 NOTE — Telephone Encounter (Signed)
48 hour EEG order faxed to [facility]. Received a receipt of confirmation.

## 2021-03-10 NOTE — Telephone Encounter (Signed)
I called Caryl Pina, wife, per DPR.  I advised her of the normal EEG results.  Patient's wife reports that patient would like to proceed with a 72 hour ambulatory EEG. While patient has not had any episodes of hours-long slurred speech, he occasionally has lapses in memory and will stutter and have some difficulty expressing his thoughts.

## 2021-03-13 ENCOUNTER — Telehealth: Payer: Self-pay | Admitting: Neurology

## 2021-03-13 NOTE — Telephone Encounter (Signed)
I returned a call from the patient's wife to [facility] answering service.  Patient had another one of his episodes of slurred speech with transient altered comprehension but seem to be getting better after resting and drinking something.  He had seen Dr. Teresa Coombs on 02/20/2020 and had EEG which was normal.  Patient has been having these episodes once or twice a month and previous brain imaging and EEG have also been negative.  Advised her to call Dr. Teresa Coombs on Monday for further instructions and if the episode recurs or is more prolonged has tonic-clonic activity to call the ambulance and take him to the ER.  She voiced understanding

## 2021-03-17 ENCOUNTER — Other Ambulatory Visit: Payer: Self-pay | Admitting: Neurology

## 2021-03-17 NOTE — Telephone Encounter (Signed)
Spoke with wife, she was contacted by [facility] after receiving the order. They will contact her to set up the EEG after obtaining approval from insurance.   Dr. April Manson

## 2021-03-17 NOTE — Telephone Encounter (Signed)
Patient's wife called and said she was told our office would call her yesterday to set up another EEG. I told her I would put a note in for Dr. Teresa Coombs to call her back

## 2021-03-17 NOTE — Telephone Encounter (Signed)
Please call wife at [phone]

## 2021-03-30 ENCOUNTER — Other Ambulatory Visit: Payer: Self-pay | Admitting: Psychiatry

## 2021-03-30 ENCOUNTER — Encounter: Payer: Self-pay | Admitting: Neurology

## 2021-03-30 DIAGNOSIS — F419 Anxiety disorder, unspecified: Secondary | ICD-10-CM

## 2021-03-30 NOTE — Telephone Encounter (Signed)
Faxed to [facility] fax # [phone].

## 2021-03-30 NOTE — Telephone Encounter (Signed)
I am not sure what [facility] is or who is supposed to be setting up his ambulatory EEG

## 2021-03-30 NOTE — Telephone Encounter (Signed)
@  10:35 this morning a [employer] @ [phone]) left vm stating has yet to be contacted re: the scheduling of the EEG.  Pt has already received approval from insurance but has not heard from anyone else.  Pt can be reached at [phone]

## 2021-04-06 NOTE — Telephone Encounter (Signed)
Patient wife left a voicemail on my phone stating that they did reach out to him to schedule his EEG, but that it is going to cost them $800 out of pocket for them. They want to know if there is another option for him because they cannot afford that.

## 2021-04-06 NOTE — Telephone Encounter (Signed)
Hi Michelle,  I will check with Claiborne Billings to see if we can do a 4hrs sleep deprived EEG in the office.   Thanks

## 2021-04-07 NOTE — Telephone Encounter (Signed)
Pt is scheduled for his In-Home Video EEG from 2/28-04/16/21, per his request.

## 2021-04-07 NOTE — Telephone Encounter (Signed)
Thank you :)

## 2021-04-29 ENCOUNTER — Encounter: Payer: Self-pay | Admitting: Psychiatry

## 2021-04-29 ENCOUNTER — Telehealth: Payer: Self-pay | Admitting: Psychiatry

## 2021-04-29 ENCOUNTER — Ambulatory Visit (INDEPENDENT_AMBULATORY_CARE_PROVIDER_SITE_OTHER): Payer: 59 | Admitting: Psychiatry

## 2021-04-29 ENCOUNTER — Other Ambulatory Visit: Payer: Self-pay

## 2021-04-29 DIAGNOSIS — F419 Anxiety disorder, unspecified: Secondary | ICD-10-CM

## 2021-04-29 DIAGNOSIS — F909 Attention-deficit hyperactivity disorder, unspecified type: Secondary | ICD-10-CM

## 2021-04-29 MED ORDER — FLUOXETINE HCL 20 MG PO CAPS: 20.0000 mg | ORAL_CAPSULE | Freq: Every day | ORAL | 2 refills | Status: DC

## 2021-04-29 NOTE — Progress Notes (Signed)
Craig MerinoJoseph F Fletcher ?[id number] ?Mar 07, 1985 ?36 y.o. ? ?Subjective:  ? ?Patient ID:  Craig MerinoJoseph F Chinn is a 36 y.o. (DOB Mar 07, 1985) male. ? ?Chief Complaint:  ?Chief Complaint  ?Patient presents with  ? Anxiety  ? Other  ?  Irritability  ? ? ?Anxiety ? ? ? ?Depression ?       Past medical history includes anxiety.   ?Craig MerinoJoseph F Wilkie presents to the office today for follow-up of anxiety and depression. Last seen 03/06/20. He reports that he has been having some difficult with "patience and anger." He reports that some things are bothering him more than usual and questions if he is "over-reacting" or if responses are warranted. He reports that he occasionally becomes verbally upset and may throw a pillow. He denies any anger at work.  He reports that his son has been having some difficulty with school work and will become frustrated when son is resistant to completing school work. He reports some rumination about this situation.  He notices some increased HR with stress and feels BP is increased during times of conflict. He reports some depression at home. He reports energy and motivation are ok at work. He reports that motivation and energy are lower when he gets home. He will put some tasks off at home. He reports that he can fall asleep easily and usually is able to stay asleep. He reports that he has been waking up feeling tired. He reports that his PCP is arranging at home sleep study. Appetite has been ok. Has been trying to eat healthier. He reports poor concentration at home "because I feel like there is so much on me." Denies SI.  ? ?He reports that if he misses medication he has felt "sluggish" or could not focus as well.  ? ?He reports that step-father was recently dx'd with dementia. Some family stressors. Son is now in 3rd grade.  ? ?No longer drinking ETOH. ? ?He [facility]. He reports that he and his wife saw [facility]Chris Andrews, [facility] in the past for couples therapy and are now  seeing a different couples therapy. ? ?Past Psychiatric Medication Trials: ?Wellbutrin- Helpful for concentration. He reports that it has helped some with depression. Reports that wife thinks this causes him to be "on edge." Was on 300 mg. ?Lexapro- Sexual side effects. Was on 20 mg and was reduced to 10 mg po qd this past month. Side effects have improved some at lower dose. Ineffective.  ?Sertraline- Sexual side effects ?Prozac ?Buspar ?Adderall XR- Had to be doing something all the time and was "overly focused." Had difficulty sleeping at higher dose. Stopped 5-6 years ?Adderall- Had insomnia and increased energy ?Strattera- did not start due to cost ?Propranolol- Ineffective ?Guanfacine- Has helped with anxiety and has helped some with concentration. ? ?GAD-7   ? ?Flowsheet Row [facility] [place]  ?Total GAD-7 Score 6 9 7 8 11   ? ?  ? ?PHQ2-9   ? ?Flowsheet Row [facility]  [facility]  ?PHQ-2 Total Score 3 4 2 3   ?PHQ-9 Total Score 10 6 7 12   ? ?  ? ?Flowsheet Row ED from 02/13/2021 [facility]  ?C-SSRS RISK CATEGORY No Risk  ? ?  ?  ? ?Review of Systems:  ?Review of Systems  ?Gastrointestinal:   ?     Less heartburn  ?Musculoskeletal:  Negative for gait problem.  ?Neurological:  Negative for speech difficulty.  ?Psychiatric/Behavioral:  Positive for depression.   ?     Please refer to HPI  ?He reports recent low Vit B 12 and Vit D  levels ? ?Medications: I have reviewed the patient's current medications. ? ?Current Outpatient Medications  ?Medication Sig Dispense Refill  ? cholecalciferol (VITAMIN D3) 25 MCG (1000 UNIT) tablet Take 1,000 Units by mouth daily.    ? famotidine (PEPCID) 20 MG tablet Take 1 tablet (20 mg total) by mouth at bedtime. (Patient taking differently: Take 20 mg by mouth daily as needed.) 30 tablet 5  ? ibuprofen (ADVIL) 800 MG tablet Take 1 tablet (800 mg total) by mouth every 8 (eight) hours as needed. 30 tablet 0  ? melatonin 5 MG TABS Take 5 mg by mouth at bedtime as needed.    ? Misc Natural Products (ELDERBERRY IMMUNE COMPLEX PO) Take by mouth.    ? FLUoxetine (PROZAC) 20 MG capsule Take 1 capsule (20 mg total) by mouth daily. 30 capsule 2  ? vitamin B-12 (CYANOCOBALAMIN) 1000 MCG tablet Take 1,000 mcg by mouth daily. (Patient not taking: Reported on 04/29/2021)    ? ?No current facility-administered medications for this visit.  ? ? ?Medication Side Effects: None ? ?Allergies: No Known Allergies ? ?Past Medical History:  ?Diagnosis Date  ? ADHD   ? Anxiety   ? ? ?Past Medical History, Surgical history, Social history, and Family history were reviewed and updated as appropriate.  ? ?Please see review of systems for further details on the patient's review from today.  ? ?Objective:  ? ?Physical Exam:  ?There were no vitals taken for this visit. ? ?Physical Exam ?Constitutional:   ?   General: He is not in acute distress. ?Musculoskeletal:     ?   General: No deformity.  ?Neurological:  ?   Mental Status: He is alert and oriented to person, place, and time.  ?   Coordination: Coordination normal.  ?Psychiatric:     ?   Attention and Perception: Attention and perception normal. He does not perceive auditory or visual hallucinations.     ?   Mood and Affect: Mood is anxious. Affect is not labile, blunt, angry or inappropriate.     ?   Speech: Speech normal.     ?   Behavior: Behavior normal.     ?   Thought Content:  Thought content normal. Thought content is not paranoid or delusional. Thought content does not include homicidal or suicidal ideation. Thought content does not include homicidal or suicidal plan.     ?   Cognition and Memory: Cognition and memory normal.     ?   Judgment: Judgment normal.  ?   Comments: Insight intact ?Dysphoric mood  ? ? ?Lab Review:  ?   ?Component Value Date/Time  ? NA 137 02/13/2021 2053  ? K 3.9 02/13/2021 2053  ? CL 102 02/13/2021 2053  ? CO2 25 02/13/2021 2053  ? GLUCOSE 94 02/13/2021 2053  ? BUN 14 02/13/2021 2053  ? CREATININE 0.90 02/13/2021 2053  ?  CALCIUM 8.7 (L) 02/13/2021 2053  ? PROT 7.4 02/13/2021 2053  ? ALBUMIN 4.4 02/13/2021 2053  ? AST 30 02/13/2021 2053  ? ALT 33 02/13/2021 2053  ? ALKPHOS 42 02/13/2021 2053  ? BILITOT 0.5 02/13/2021 2053  ? GFRNONAA >60 02/13/2021 2053  ? GFRAA >60 06/18/2019 1850  ? ? ?   ?Component Value Date/Time  ? WBC 4.6 02/13/2021 2053  ? RBC 4.63 02/13/2021 2053  ? HGB 13.6 02/13/2021 2053  ? HCT 40.2 02/13/2021 2053  ? PLT 308 02/13/2021 2053  ? MCV 86.8 02/13/2021 2053  ? MCH 29.4 02/13/2021 2053  ? MCHC 33.8 02/13/2021 2053  ? RDW 13.3 02/13/2021 2053  ? LYMPHSABS 2.1 02/13/2021 2053  ? MONOABS 0.4 02/13/2021 2053  ? EOSABS 0.1 02/13/2021 2053  ? BASOSABS 0.0 02/13/2021 2053  ? ? ?No results found for: POCLITH, LITHIUM  ? ?No results found for: PHENYTOIN, PHENOBARB, VALPROATE, CBMZ  ? ?.res ?Assessment: Plan:   ? ?Pt seen for 45 minutes and time spent discussing recent symptoms, stressors, and response to Prozac 10 mg po qd. Time also spent reviewing medical record and discussing neurological s/s. Discussed potential benefits, risks, and side effects of increasing Prozac to 20 mg po qd to improve mood and anxiety since he is tolerating 10 mg dose without difficulty. Discussed that 10 mg is low dose and dose likely needs to be increased to a therapeutic dose. Pt agrees to trial of Prozac 20 mg daily.  ?Discussed that it may be helpful to re-start  individual therapy and pt agrees. Discussed that provider would contact Elio Forget, [facility] about possibility of resuming therapy.  ?Pt to follow-up in 6 weeks or sooner if clinically indicated. ?Patient advised to co

## 2021-04-29 NOTE — Telephone Encounter (Signed)
He has been prescribed only Prozac 10 mg one capsule daily in the past and reported today that he has been taking one 10 mg capsule daily. Recommend increasing to 20 mg daily since 10 mg is a low dose and he has been tolerating it without difficulty. Will discuss further with patient at his next appointment.  ?

## 2021-04-29 NOTE — Telephone Encounter (Signed)
Pt's wife LVM at 1:23p.  She is on DPR.  Pt was here earlier today for appt with Shanda Bumps.  Wife called to say that he has been on Prozac 20mg  for quite a while.  It does not help.  He forgot to give this info to [facility] today, but would like to change meds. ? ?Next appt 4/26 ?

## 2021-04-29 NOTE — Telephone Encounter (Signed)
Wife stated he has been taking prozac 20 mg daily and he see's no benefit from it.She stated he needs something to manage anxiety

## 2021-05-11 ENCOUNTER — Other Ambulatory Visit: Payer: Self-pay

## 2021-05-11 ENCOUNTER — Ambulatory Visit (INDEPENDENT_AMBULATORY_CARE_PROVIDER_SITE_OTHER): Payer: 59 | Admitting: Mental Health

## 2021-05-11 DIAGNOSIS — F419 Anxiety disorder, unspecified: Secondary | ICD-10-CM | POA: Diagnosis not present

## 2021-05-11 NOTE — Progress Notes (Signed)
[facility] Counselor/therapist psychotherapy note ? ?Name: Craig Fletcher ?Date: 05/11/21 ?[id number] ?DOB: February 09, 1986 ?PCP: Creola Corn, MD ? ?Time spent:  51 minutes  ? ?Treatment: individual therapy ? ?Mental Status Exam: ?  ? ?Appearance:   casual  ?Behavior:  WNL  ?/Motor:  WNL  ?Speech/Language:   Clear and Coherent  ?Affect:  Full range  ?Mood:  depressed  ?Thought process:  normal  ?Thought content:    WNL  ?Sensory/Perceptual disturbances:    WNL  ?Orientation:  x4  ?Attention:  Good  ?Concentration:  Good  ?Memory:  WNL  ?Fund of knowledge:   Good  ?Insight:    Good  ?Judgment:   Good  ?Impulse Control:  Good  ? ?Reported Symptoms:  Anxious, irritability, distractible, sadness, distractible, some problems w/ focus ? ?Risk Assessment: ?Danger to Self:  No ?Self-injurious Behavior: No ?Danger to Others: No ?Duty to Warn:no ?Physical Aggression / Violence:No  ?Access to Firearms a concern: No  ?Gang Involvement:No  ?Patient / guardian was educated about steps to take if suicide or homicide risk level increases between visits: yes ?While future psychiatric events cannot be accurately predicted, the patient does not currently require acute inpatient psychiatric care and does not currently meet [facility] involuntary commitment criteria.  ?  ?Subjective:   Patient presents for session on time.  It has been several months since his last visit.  Patient shared how he and his wife have continued marital counseling for the last few months and also that his son is in individual counseling.  He went on to share how he and his wife continue to have challenges in their relationship, sharing details, examples of how he feels that she has a tendency to be controlling.  He shared how this can occur with planning vacations or other local activities together.  He stated that she will often want to now every appointment he has, regardless if she is at work or not.  Patient stated he does not understand her having this  need, how she will often blames him for not communicating enough even if it is a mundane detail of their daily lives, such as his stopping to have lunch with his mother.  He stated that this example occurred recently and how his wife is at work but yet she got upset that she was not invited.  He wants to further clarify through therapy if he is reacting appropriately to these situations as he expresses his commitment to working on his marriage. ? ? ?Interventions: Communication strategies, supportive approaches, problem solving approaches ? ?Diagnoses:  ?Adjustment disorder: AD/HD  ? ?Individualized Plan of Care:  ? ?1. Patient/family to engage in family psychotherapy 2-4 times a month or as needed. ? ?2. Patient/family is to use communication, coping skills to improve family relationships.   ? ?3.  Patient will identify when he needs time to remove himself from a situation in which he may make comments that are hurtful.  Wife and patient will focus on active listening to further understand her husband's intentions as opposed to reacting impulsively. ? ?4. Patient/family to contact this office, go to the local ED or call 911 if a crisis or emergency develops between visits. ? ?Progress: Progressing ? ? ?[name], [facility]  ?

## 2021-05-20 ENCOUNTER — Ambulatory Visit (INDEPENDENT_AMBULATORY_CARE_PROVIDER_SITE_OTHER): Payer: 59 | Admitting: Mental Health

## 2021-05-20 DIAGNOSIS — F909 Attention-deficit hyperactivity disorder, unspecified type: Secondary | ICD-10-CM

## 2021-05-20 NOTE — Progress Notes (Signed)
[facility] Counselor/therapist psychotherapy note ? ?Name: Craig Fletcher ?Date: 05/20/21 ?[id number]:/[id number] ?DOB: 12-Mar-1985 ?PCP: Creola Corn, MD ? ?Time spent:  50 minutes  ? ?Treatment: individual therapy ? ?Mental Status Exam: ?  ? ?Appearance:   casual  ?Behavior:  WNL  ?/Motor:  WNL  ?Speech/Language:   Clear and Coherent  ?Affect:  Full range  ?Mood:  depressed  ?Thought process:  normal  ?Thought content:    WNL  ?Sensory/Perceptual disturbances:    WNL  ?Orientation:  x4  ?Attention:  Good  ?Concentration:  Good  ?Memory:  WNL  ?Fund of knowledge:   Good  ?Insight:    Good  ?Judgment:   Good  ?Impulse Control:  Good  ? ?Reported Symptoms:  Anxious, irritability, distractible, sadness, distractible, some problems w/ focus ? ?Risk Assessment: ?Danger to Self:  No ?Self-injurious Behavior: No ?Danger to Others: No ?Duty to Warn:no ?Physical Aggression / Violence:No  ?Access to Firearms a concern: No  ?Gang Involvement:No  ?Patient / guardian was educated about steps to take if suicide or homicide risk level increases between visits: yes ?While future psychiatric events cannot be accurately predicted, the patient does not currently require acute inpatient psychiatric care and does not currently meet [facility] involuntary commitment criteria.  ?  ?Subjective:   Patient presents for session on time.  He shared recent events, how he and his family plan to take a vacation beginning this weekend.  He stated that he is mother and father-in-law will also be attending as they were taking a cruise.  He stated that they need to travel several hours on a vacation, how his wife came home last night and abruptly began to mention benefits of their flying as opposed to driving.  He stated that he has been somewhat more agitated this morning he feels in part due to it being unsettled about their plans.  At this point he would rather they just make a decision and stick to it.  He went on to share how their son has been playing  video games, he got in trouble at school due to making a comment that was concerning, patient stated this resulted in his wife accusing him of being partially responsible.  Patient shared how he feels he is often to blame as his wife is often critical of him going on to share additional details as well as expounding on some content from previous session related to her having to know his whereabouts at all times.  He denies that there is any history that would be a catalyst for her having these tendencies, how he gets frustrated but tries to be mindful of how he reacts as to not cause further arguments.  Facilitated his identifying needs and subsequent ways he wants to handle his own communication which is his primary focus. ? ? ? ?Interventions: Communication strategies, supportive approaches, motivational interviewing ? ?Diagnoses:  ?Adjustment disorder: AD/HD  ? ?Individualized Plan of Care:  ? ?1. Patient to engage in family psychotherapy 2-4 times a month or as needed. ? ?2. Patient is to use communication, coping skills to improve family relationships.   ? ?3.  Patient will identify when he needs time to remove himself from a situation in which he may make comments that are hurtful.   ? ?4. Patient/family to contact this office, go to the local ED or call 911 if a crisis or emergency develops between visits. ? ?Progress: Progressing ? ? ?[name], [facility]  ?

## 2021-06-08 ENCOUNTER — Ambulatory Visit (INDEPENDENT_AMBULATORY_CARE_PROVIDER_SITE_OTHER): Payer: 59 | Admitting: Mental Health

## 2021-06-08 DIAGNOSIS — F4323 Adjustment disorder with mixed anxiety and depressed mood: Secondary | ICD-10-CM

## 2021-06-08 DIAGNOSIS — F909 Attention-deficit hyperactivity disorder, unspecified type: Secondary | ICD-10-CM

## 2021-06-08 NOTE — Progress Notes (Signed)
[facility] Counselor/therapist psychotherapy note ? ?Name: Craig Fletcher ?Date: 06/08/21 ?[id number] ?DOB: 1985/11/17 ?PCP: Creola Corn, MD ? ?Time spent:  47 minutes  ? ?Treatment: individual therapy ? ?Mental Status Exam: ?  ? ?Appearance:   casual  ?Behavior:  WNL  ?/Motor:  WNL  ?Speech/Language:   Clear and Coherent  ?Affect:  Full range  ?Mood:  depressed  ?Thought process:  normal  ?Thought content:    WNL  ?Sensory/Perceptual disturbances:    WNL  ?Orientation:  x4  ?Attention:  Good  ?Concentration:  Good  ?Memory:  WNL  ?Fund of knowledge:   Good  ?Insight:    Good  ?Judgment:   Good  ?Impulse Control:  Good  ? ?Reported Symptoms:  Anxious, irritability, distractible, sadness, distractible, some problems w/ focus ? ?Risk Assessment: ?Danger to Self:  No ?Self-injurious Behavior: No ?Danger to Others: No ?Duty to Warn:no ?Physical Aggression / Violence:No  ?Access to Firearms a concern: No  ?Gang Involvement:No  ?Patient / guardian was educated about steps to take if suicide or homicide risk level increases between visits: yes ?While future psychiatric events cannot be accurately predicted, the patient does not currently require acute inpatient psychiatric care and does not currently meet [facility] involuntary commitment criteria.  ?  ?Subjective:   Patient presents for session on time.  Patient shared recent events, how he and his family went on a recent vacation, a cruise which lasted about a week. He said that me and the experiences, which he shared in session, we're very pleasant and memorable. He said that there was one day that his wife was noticeably agitated over seemingly mundane issues per patient. He shared how he trying to actively listen in these moments, although he did not fully understand why she was getting as upset at the time. He said that later in the day, she apologized to him and her parents who were also accompanying them on the vacation. Patient continues to work to be  mindful of his reactions in the relationship with his wife, working to improve his communication. ? ? ?Interventions: Communication strategies, supportive approaches, motivational interviewing ? ?Diagnoses:  ?Adjustment disorder: AD/HD  ? ?Individualized Plan of Care:  ? ?1. Patient to engage in family psychotherapy 2-4 times a month or as needed. ? ?2. Patient is to use communication, coping skills to improve family relationships.   ? ?3.  Patient will identify when he needs time to remove himself from a situation in which he may make comments that are hurtful.   ? ?4. Patient/family to contact this office, go to the local ED or call 911 if a crisis or emergency develops between visits. ? ?Progress: Progressing ? ? ?[name], [facility]  ?

## 2021-06-10 ENCOUNTER — Encounter: Payer: Self-pay | Admitting: Psychiatry

## 2021-06-10 ENCOUNTER — Ambulatory Visit (INDEPENDENT_AMBULATORY_CARE_PROVIDER_SITE_OTHER): Payer: 59 | Admitting: Psychiatry

## 2021-06-10 DIAGNOSIS — F909 Attention-deficit hyperactivity disorder, unspecified type: Secondary | ICD-10-CM | POA: Diagnosis not present

## 2021-06-10 DIAGNOSIS — F419 Anxiety disorder, unspecified: Secondary | ICD-10-CM

## 2021-06-10 MED ORDER — FLUOXETINE HCL 20 MG PO CAPS: 20.0000 mg | ORAL_CAPSULE | Freq: Every day | ORAL | 3 refills | Status: DC

## 2021-06-10 NOTE — Progress Notes (Signed)
Carrolyn Leigh ?[id number] ?1986-01-08 ?36 y.o. ? ?Subjective:  ? ?Patient ID:  Craig Fletcher is a 36 y.o. (DOB [birth year]) male. ? ?Chief Complaint:  ?Chief Complaint  ?Patient presents with  ? Follow-up  ?  Depression, anxiety  ? ? ?HPI ?Craig Fletcher presents to the office today for follow-up of depression. He reports that he is, "doing better" with increased dose and taking meds more consistently. He reports that he has been able to take on more. He reports that his patience has improved. Denies any recent anger other than one day when there was conflict with wife. Denies depressed mood. Energy and motivation have been good. He reports some stress with multiple responsibilities and stressors. He reports that he no longer experiences increased HR or BP with anxiety. Sleep has been good. Appetite has been ok. He reports that he was eating healthy until recent cruise. Concentration has been ok. He reports that he works best with focusing on one task instead of multi-tasking. Denies SI.  ? 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 He will do more planning and coordination and others will do the teaching.  ? ?He hopes to make a promotion by the end of the year.  ? ?Recently returned from vacation and enjoyed this. He reports that he and his wife have been getting along ok. They continue to go to marital counseling.  ? ?Step-father just had surgery and is helping them with mowing their yard.  ? ?Past Psychiatric Medication Trials: ?Wellbutrin- Helpful for concentration. He reports that it has helped some with depression. Reports that wife thinks this causes him to be "on edge." Was on 300 mg. ?Lexapro- Sexual side effects. Was on 20 mg and was reduced to 10 mg po qd this past month. Side effects have improved some at lower dose. Ineffective.  ?Sertraline- Sexual side effects ?Prozac ?Buspar ?Adderall XR- Had to be doing something all the time and was "overly focused." Had difficulty sleeping at higher  dose. Stopped 5-6 years ?Adderall- Had insomnia and increased energy ?Strattera- did not start due to cost ?Propranolol- Ineffective ?Guanfacine- Has helped with anxiety and has helped some with concentration. ?  ? ?GAD-7   ? ?[place] Office Visit from 06/01/2019 in [place] at [facility] Visit from 07/05/2018 in [place] at [facility] Visit from 05/31/2018 in [place] at [facility] Visit from 12/16/2017 in [place] at [facility] Visit from 11/11/2017 in [place] at [place]  ?Total GAD-7 Score 6 9 7 8 11   ? ?  ? ?PHQ2-9   ? ?[place] Office Visit from 07/05/2018 in [place] at [facility] Visit from 05/31/2018 in [place] at [facility] Visit from 12/16/2017 in [place] at [facility] Visit from 11/11/2017 in [place] at [place]  ?PHQ-2 Total Score 3 4 2 3   ?PHQ-9 Total Score 10 6 7 12   ? ?  ? ?[place] ED from 02/13/2021 in [place]  ?C-SSRS RISK CATEGORY No Risk  ? ?  ?  ? ?Review of Systems:  ?Review of Systems  ?Gastrointestinal: Negative.   ?Musculoskeletal:  Negative for gait problem.  ?Neurological:  Negative for tremors.  ?Psychiatric/Behavioral:    ?     Please refer to HPI  ? ?Medications: I have reviewed the patient's current medications. ? ?Current Outpatient Medications  ?Medication Sig Dispense Refill  ? cholecalciferol (VITAMIN D3)  25 MCG (1000 UNIT) tablet Take 1,000 Units by mouth daily. (Patient not taking: Reported on 06/10/2021)    ? famotidine (PEPCID) 20 MG tablet Take 1 tablet (20 mg total) by mouth at bedtime. (Patient taking differently: Take 20 mg by mouth daily as needed.) 30 tablet 5  ? FLUoxetine (PROZAC) 20 MG capsule Take 1 capsule (20 mg total) by mouth daily. 30 capsule 3  ? ibuprofen (ADVIL) 800 MG tablet Take 1 tablet (800 mg total) by mouth every 8 (eight) hours as needed. 30 tablet 0   ? melatonin 5 MG TABS Take 5 mg by mouth at bedtime as needed.    ? Misc Natural Products (ELDERBERRY IMMUNE COMPLEX PO) Take by mouth.    ? vitamin B-12 (CYANOCOBALAMIN) 1000 MCG tablet Take 1,000 mcg by mouth daily. (Patient not taking: Reported on 04/29/2021)    ? Vitamin D, Ergocalciferol, (DRISDOL) 1.25 MG (50000 UNIT) CAPS capsule Take 50,000 Units by mouth once a week.    ? ?No current facility-administered medications for this visit.  ? ? ?Medication Side Effects: None ? ?Allergies: No Known Allergies ? ?Past Medical History:  ?Diagnosis Date  ? ADHD   ? Anxiety   ? ? ?Past Medical History, Surgical history, Social history, and Family history were reviewed and updated as appropriate.  ? ?Please see review of systems for further details on the patient's review from today.  ? ?Objective:  ? ?Physical Exam:  ?There were no vitals taken for this visit. ? ?Physical Exam ?Constitutional:   ?   General: He is not in acute distress. ?Musculoskeletal:     ?   General: No deformity.  ?Neurological:  ?   Mental Status: He is alert and oriented to person, place, and time.  ?   Coordination: Coordination normal.  ?Psychiatric:     ?   Attention and Perception: Attention and perception normal. He does not perceive auditory or visual hallucinations.     ?   Mood and Affect: Mood normal. Mood is not anxious or depressed. Affect is not labile, blunt, angry or inappropriate.     ?   Speech: Speech normal.     ?   Behavior: Behavior normal.     ?   Thought Content: Thought content normal. Thought content is not paranoid or delusional. Thought content does not include homicidal or suicidal ideation. Thought content does not include homicidal or suicidal plan.     ?   Cognition and Memory: Cognition and memory normal.     ?   Judgment: Judgment normal.  ?   Comments: Insight intact  ? ? ?Lab Review:  ?   ?Component Value Date/Time  ? NA 137 02/13/2021 2053  ? K 3.9 02/13/2021 2053  ? CL 102 02/13/2021 2053  ? CO2 25 02/13/2021  2053  ? GLUCOSE 94 02/13/2021 2053  ? BUN 14 02/13/2021 2053  ? CREATININE 0.90 02/13/2021 2053  ? CALCIUM 8.7 (L) 02/13/2021 2053  ? PROT 7.4 02/13/2021 2053  ? ALBUMIN 4.4 02/13/2021 2053  ? AST 30 02/13/2021 2053  ? ALT 33 02/13/2021 2053  ? ALKPHOS 42 02/13/2021 2053  ? BILITOT 0.5 02/13/2021 2053  ? GFRNONAA >60 02/13/2021 2053  ? GFRAA >60 06/18/2019 1850  ? ? ?   ?Component Value Date/Time  ? WBC 4.6 02/13/2021 2053  ? RBC 4.63 02/13/2021 2053  ? HGB 13.6 02/13/2021 2053  ? HCT 40.2 02/13/2021 2053  ? PLT 308 02/13/2021 2053  ? MCV 86.8 02/13/2021  2053  ? [place] 29.4 02/13/2021 2053  ? MCHC 33.8 02/13/2021 2053  ? RDW 13.3 02/13/2021 2053  ? LYMPHSABS 2.1 02/13/2021 2053  ? MONOABS 0.4 02/13/2021 2053  ? EOSABS 0.1 02/13/2021 2053  ? BASOSABS 0.0 02/13/2021 2053  ? ? ?No results found for: POCLITH, LITHIUM  ? ?No results found for: PHENYTOIN, PHENOBARB, VALPROATE, CBMZ  ? ?.res ?Assessment: Plan:   ?Pt seen for 25 minutes and time spent discussing response to increase in Prozac. He reports improved depression, irritability, and anxiety since increase in Prozac. He reports that he is not having side effects and would like to continue Prozac 20 mg po qd for mood and anxiety. He reports adequate concentration.  ?Pt to follow-up in 3 months or sooner if clinically indicated.  ?Recommend continuing psychotherapy with Lanetta Inch, [facility].  ?Patient advised to contact office with any questions, adverse effects, or acute worsening in signs and symptoms. ? ?Craig Fletcher was seen today for follow-up. ? ?Diagnoses and all orders for this visit: ? ?Anxiety disorder, unspecified type ?-     FLUoxetine (PROZAC) 20 MG capsule; Take 1 capsule (20 mg total) by mouth daily. ? ?  ? ?Please see After Visit Summary for patient specific instructions. ? ?Future Appointments  ?Date Time Provider Jenkins  ?06/29/2021  9:00 AM Anson Oregon, [facility] CP-CP None  ?07/10/2021  9:00 AM Anson [place], [facility] CP-CP None   ?07/22/2021  9:00 AM Anson Oregon, [facility] CP-CP None  ?08/12/2021  9:00 AM Anson Oregon, [facility] CP-CP None  ?09/09/2021  9:30 AM Thayer Headings, [facility] CP-CP None  ? ? ?No orders of the defined types wer

## 2021-06-22 ENCOUNTER — Telehealth: Payer: Self-pay | Admitting: Neurology

## 2021-06-22 NOTE — Telephone Encounter (Signed)
Left message for a return call. ? ?When the patient or his wife calls back, please schedule a follow up with Dr. Teresa Coombs. ?

## 2021-06-22 NOTE — Telephone Encounter (Signed)
I spoke to his patient's wife. ? ?She estimates a slurred speech episode at least once every two weeks. Last night, he got out of the bed, staring blankly and repeating the word "what" over and over. This lasted about 5 minutes. His wife had him lay back down. She brought him some water. He was confused and had no recollection of the event.  He has been staying well hydrated, getting enough sleep, not using alcohol.  ? ?They were unable to afford the ambulatory EEG ($800 out-of-pocket). She was shocked at the cost. States they have excellent medical coverage. ? ? ? ?

## 2021-06-22 NOTE — Telephone Encounter (Signed)
Pt's wife, Sreekar Bro[place] (on [place]) same issues continuing episodes of slurred speeches. Have not been to the ER. Would like a call from the nurse. ?

## 2021-06-22 NOTE — Telephone Encounter (Signed)
I will schedule them for a follow up visit.  ?

## 2021-06-22 NOTE — Telephone Encounter (Signed)
Wife refused to make appt with Dr Teresa Coombs, states appt is not necessary and requesting a call back form Dr Teresa Coombs only.   ?

## 2021-06-23 NOTE — Telephone Encounter (Signed)
Pt was called, vm left asking pt to call and schedule needed f/u ?

## 2021-06-23 NOTE — Telephone Encounter (Signed)
Spoke with wife, they are willing to come in for an appointment. Please contact to schedule a follow up appointment. At follow up we will discuss obtaining a 2 or 4 hours sleep deprived EEG and initiating antiseizure medication.  ? ?Dr. April Manson

## 2021-06-24 ENCOUNTER — Encounter: Payer: Self-pay | Admitting: *Deleted

## 2021-06-24 NOTE — Telephone Encounter (Addendum)
I tried to reach his wife again last night after hours. No answer. Attempted third time this morning. Left another message requesting a call back to get the patient scheduled. Also, sent mychart message. ? ?When they call back, please schedule a follow up with Dr.Camara.  ?

## 2021-06-29 ENCOUNTER — Ambulatory Visit (INDEPENDENT_AMBULATORY_CARE_PROVIDER_SITE_OTHER): Payer: 59 | Admitting: Mental Health

## 2021-06-29 DIAGNOSIS — F419 Anxiety disorder, unspecified: Secondary | ICD-10-CM

## 2021-06-29 NOTE — Progress Notes (Signed)
[facility] Counselor/therapist psychotherapy note ? ?Name: Craig Fletcher ?Date: 06/29/21 ?[id number]: [birth year] ?PCP: Shon Baton, MD ? ?Time spent:  45 minutes  ? ?Treatment: individual therapy ? ?Mental Status Exam: ?  ? ?Appearance:   casual  ?Behavior:  WNL  ?/Motor:  WNL  ?Speech/Language:   Clear and Coherent  ?Affect:  Full range  ?Mood:  Euthymic  ?Thought process:  normal  ?Thought content:    WNL  ?Sensory/Perceptual disturbances:    WNL  ?Orientation:  x4  ?Attention:  Good  ?Concentration:  Good  ?Memory:  WNL  ?Fund of knowledge:   Good  ?Insight:    Good  ?Judgment:   Good  ?Impulse Control:  Good  ? ?Reported Symptoms:  Anxious, irritability, distractible, sadness, distractible, some problems w/ focus ? ?Risk Assessment: ?Danger to Self:  No ?Self-injurious Behavior: No ?Danger to Others: No ?Duty to Warn:no ?Physical Aggression / Violence:No  ?Access to Firearms a concern: No  ?Gang Involvement:No  ?Patient / guardian was educated about steps to take if suicide or homicide risk level increases between visits: yes ?While future psychiatric events cannot be accurately predicted, the patient does not currently require acute inpatient psychiatric care and does not currently meet [facility] involuntary commitment criteria.  ?  ?Subjective:   Patient presents for session on time.  Patient stated that he and his wife have had some less frequency and arguing over the last couple of weeks.  He stated that they had a pleasant Mother's Day weekend as he cooked for her, his mother and his mother-in-law yesterday and did some cleaning.  He went on to share how he and his wife have an ongoing disagreement that can be devolved into arguments.  He stated that they argue about the air conditioner, [name] specifically.  He stated that his wife needs it significantly cool in the house, setting it to sometimes 50 degrees.  He stated that he has tried to discuss in great detail how the air  conditioner is working fine, it being only a couple of months old, although she wants that service.  He stated that she wants it significantly cooler in the house, and how he is trying to be mindful of their budget and also trying to communicate in a way that does not create arguments.  Provide support and explored collaboratively in session ways to communicate while also staying calm which was identified as an ongoing need by patient as the issue has been repetitive. ? ? ? ?Interventions: Communication strategies, supportive approaches, motivational interviewing ? ?Diagnoses:  ?Adjustment disorder: AD/HD  ? ?Individualized Plan of Care:  ? ?1. Patient to engage in family psychotherapy 2-4 times a month or as needed. ? ?2. Patient is to use communication, coping skills to improve family relationships.   ? ?3.  Patient will identify when he needs time to remove himself from a situation in which he may make comments that are hurtful.   ? ?4. Patient/family to contact this office, go to the local ED or call 911 if a crisis or emergency develops between visits. ? ?Progress: Progressing ? ? ?Anson [place], [facility]  ?

## 2021-07-07 ENCOUNTER — Other Ambulatory Visit: Payer: 59 | Admitting: *Deleted

## 2021-07-10 ENCOUNTER — Ambulatory Visit (INDEPENDENT_AMBULATORY_CARE_PROVIDER_SITE_OTHER): Payer: 59 | Admitting: Mental Health

## 2021-07-10 DIAGNOSIS — F909 Attention-deficit hyperactivity disorder, unspecified type: Secondary | ICD-10-CM

## 2021-07-10 NOTE — Progress Notes (Signed)
[facility] Counselor/therapist psychotherapy note  Name: Craig Fletcher Date: 07/10/21 [id number] DOB: 1985/03/11 PCP: Shon Baton, MD  Time spent:  50 minutes   Treatment: individual therapy  Mental Status Exam:    Appearance:   casual  Behavior:  WNL  /Motor:  WNL  Speech/Language:   Clear and Coherent  Affect:  Full range  Mood:  Euthymic  Thought process:  normal  Thought content:    WNL  Sensory/Perceptual disturbances:    WNL  Orientation:  x4  Attention:  Good  Concentration:  Good  Memory:  WNL  Fund of knowledge:   Good  Insight:    Good  Judgment:   Good  Impulse Control:  Good   Reported Symptoms:  Anxious, irritability, distractible, sadness, distractible, some problems w/ focus  Risk Assessment: Danger to Self:  No Self-injurious Behavior: No Danger to Others: No Duty to Warn:no Physical Aggression / Violence:No  Access to Firearms a concern: No  Gang Involvement:No  Patient / guardian was educated about steps to take if suicide or homicide risk level increases between visits: yes While future psychiatric events cannot be accurately predicted, the patient does not currently require acute inpatient psychiatric care and does not currently meet [facility] involuntary commitment criteria.    Subjective:   Patient presents for session on time.  Patient stated that he and his wife have had some less frequency and arguing over the last couple of weeks.  He stated that they had a pleasant Mother's Day weekend as he cooked for her, his mother and his mother-in-law yesterday and did some cleaning.  He went on to share how he and his wife have an ongoing disagreement that can be devolved into arguments.  He stated that they argue about the air conditioner, [name] specifically.  He stated that his wife needs it significantly cool in the house, setting it to sometimes 50 degrees.  He stated that he has tried to discuss in great detail how the air  conditioner is working fine, it being only a couple of months old, although she wants that service.  He stated that she wants it significantly cooler in the house, and how he is trying to be mindful of their budget and also trying to communicate in a way that does not create arguments.  Provide support and explored collaboratively in session ways to communicate while also staying calm which was identified as an ongoing need by patient as the issue has been repetitive.    Interventions: Communication strategies, supportive approaches, motivational interviewing  Diagnoses:  Adjustment disorder: AD/HD   Individualized Plan of Care:   1. Patient to engage in family psychotherapy 2-4 times a month or as needed.  2. Patient is to use communication, coping skills to improve family relationships.    3.  Patient will identify when he needs time to remove himself from a situation in which he may make comments that are hurtful.    4. Patient/family to contact this office, go to the local ED or call 911 if a crisis or emergency develops between visits.  Progress: Progressing   Anson [place], [facility]

## 2021-07-22 ENCOUNTER — Ambulatory Visit: Payer: 59 | Admitting: Mental Health

## 2021-07-22 ENCOUNTER — Encounter: Payer: Self-pay | Admitting: Neurology

## 2021-07-22 ENCOUNTER — Telehealth: Payer: Self-pay | Admitting: Neurology

## 2021-07-22 ENCOUNTER — Ambulatory Visit: Payer: 59 | Admitting: Neurology

## 2021-07-22 NOTE — Progress Notes (Deleted)
No charge for missed appt. °

## 2021-07-22 NOTE — Telephone Encounter (Signed)
Pt no show due to work conflict. Transferred pt to Billing.

## 2021-08-10 ENCOUNTER — Encounter: Payer: Self-pay | Admitting: Neurology

## 2021-08-10 ENCOUNTER — Ambulatory Visit: Payer: 59 | Admitting: Neurology

## 2021-08-11 ENCOUNTER — Ambulatory Visit: Payer: 59 | Admitting: Neurology

## 2021-08-11 ENCOUNTER — Encounter: Payer: Self-pay | Admitting: Neurology

## 2021-08-11 VITALS — BP 148/90 | HR 73 | Ht 72.0 in | Wt 220.0 lb

## 2021-08-11 DIAGNOSIS — R4182 Altered mental status, unspecified: Secondary | ICD-10-CM | POA: Diagnosis not present

## 2021-08-11 DIAGNOSIS — R4781 Slurred speech: Secondary | ICD-10-CM

## 2021-08-11 MED ORDER — LAMOTRIGINE 25 MG PO TABS: ORAL_TABLET | ORAL | 0 refills | Status: DC

## 2021-08-11 NOTE — Progress Notes (Signed)
[facility]  PATIENT: Craig Fletcher DOB: Apr 11, 1985  REQUESTING CLINICIAN: Creola Corn, MD HISTORY FROM: Patient and wife  REASON FOR VISIT: Episode of slurred speech   HISTORICAL  CHIEF COMPLAINT:  Chief Complaint  Patient presents with   Follow-up    Room 12, alone  Reports slurred speech, and forgetfulness,     INTERVAL HISTORY 08/11/2021:  Patient presents today for follow-up, wife was available via phone.  She still report ongoing symptoms of slurred speech, altered mental status, this happens weekly.  Sometimes it is related to patient not drinking enough fluid or related to alcohol use and sometimes there are no triggers.   Patient reported sometimes he is not aware of the symptoms and tell wife pointed out to him.   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  Per wife, the first episode happened about 2-1/2 years ago while he was at the beach and possibly had some alcohol.  They related it to alcohol intake.  Since then patient has about 6 episodes of slurred speech, during this time, he is aware, wife noted that he is a little argumentative and irritable and his speech is slurred, he is able to comprehend, and follow direction.  The last episode was last Friday and lasted about 5 hours.  It took about 4 people to convince him to go to the ED and he initially refused to go.  He has been seen previously in the emergency department, had Brain MRI which was negative for any acute stroke. In his last presentation he also had a head CT which was negative for acute stroke.  Most of the episodes are associated with alcohol intake but patient reported taking only one sip of alcohol but wife noted there was maybe 1 or 2 episodes that were not associated with slurred with alcohol intake that they attributed to dehydration.   He is a IT sales professional, denies  any previous history of TBI, no previous history of seizures, no family history of seizures but his family history of alcohol abuse.  Wife also reports trouble with memory, trouble with focusing, he has been cutting down on alcohol and energy drinks.  He carries a diagnosis of ADHD and he is on Adderall.   OTHER MEDICAL CONDITIONS: Anxiety, ADHD    REVIEW OF SYSTEMS: Full 14 system review of systems performed and negative with exception of: as ntoed in the HPI   ALLERGIES: No Known Allergies  HOME MEDICATIONS: Outpatient Medications Prior to Visit  Medication Sig Dispense Refill   FLUoxetine (PROZAC) 20 MG capsule Take 1 capsule (20 mg total) by mouth daily. 30 capsule 3   ibuprofen (ADVIL) 800 MG tablet Take 1 tablet (800 mg total) by mouth every 8 (eight) hours as needed. 30 tablet 0   melatonin 5 MG TABS Take 5 mg by mouth at bedtime as needed.     Misc Natural Products (ELDERBERRY IMMUNE COMPLEX PO) Take by mouth.     vitamin B-12 (CYANOCOBALAMIN) 1000 MCG tablet Take 1,000 mcg by mouth daily.     Vitamin D, Ergocalciferol, (DRISDOL) 1.25 MG (50000 UNIT) CAPS capsule Take 50,000 Units by mouth once a week.     cholecalciferol (VITAMIN D3) 25 MCG (1000 UNIT) tablet Take 1,000 Units by mouth daily. (Patient not taking: Reported on 06/10/2021)     famotidine (PEPCID) 20 MG tablet Take 1 tablet (20 mg total)  by mouth at bedtime. (Patient taking differently: Take 20 mg by mouth daily as needed.) 30 tablet 5   No facility-administered medications prior to visit.    PAST MEDICAL HISTORY: Past Medical History:  Diagnosis Date   ADHD    Anxiety     PAST SURGICAL HISTORY: Past Surgical History:  Procedure Laterality Date   CHEST WALL RECONSTRUCTION      FAMILY HISTORY: Family History  Problem Relation Age of Onset   Depression Mother    Alcohol abuse Father     SOCIAL HISTORY: Social History   Socioeconomic History   Marital status: Married    Spouse name: Not on file    Number of children: Not on file   Years of education: Not on file   Highest education level: Not on file  Occupational History   Not on file  Tobacco Use   Smoking status: Never   Smokeless tobacco: Never  Substance and Sexual Activity   Alcohol use: Not Currently    Comment: rare/occ   Drug use: No   Sexual activity: Not on file  Other Topics Concern   Not on file  Social History Narrative   Not on file   Social Determinants of Health   Financial Resource Strain: Not on file  Food Insecurity: Not on file  Transportation Needs: Not on file  Physical Activity: Not on file  Stress: Not on file  Social Connections: Not on file  Intimate Partner Violence: Not on file    PHYSICAL EXAM  GENERAL EXAM/CONSTITUTIONAL: Vitals:  Vitals:   08/11/21 1004  BP: (!) 148/90  Pulse: 73  Weight: 220 lb (99.8 kg)  Height: 6' (1.829 m)    Body mass index is 29.84 kg/m. Wt Readings from Last 3 Encounters:  08/11/21 220 lb (99.8 kg)  02/19/21 231 lb 8 oz (105 kg)  02/13/21 214 lb 15.2 oz (97.5 kg)   Patient is in no distress; well developed, nourished and groomed; neck is supple  EYES: Pupils round and reactive to light, Visual fields full to confrontation, Extraocular movements intacts,   MUSCULOSKELETAL: Gait, strength, tone, movements noted in Neurologic exam below  NEUROLOGIC: MENTAL STATUS:      No data to display         awake, alert, oriented to person, place and time recent and remote memory intact normal attention and concentration language fluent, comprehension intact, naming intact fund of knowledge appropriate  CRANIAL NERVE:  2nd, 3rd, 4th, 6th - pupils equal and reactive to light, visual fields full to confrontation, extraocular muscles intact, no nystagmus 5th - facial sensation symmetric 7th - facial strength symmetric 8th - hearing intact 9th - palate elevates symmetrically, uvula midline 11th - shoulder shrug symmetric 12th - tongue protrusion  midline  MOTOR:  normal bulk and tone, full strength in the BUE, BLE  SENSORY:  normal and symmetric to light touch, pinprick, temperature, vibration  COORDINATION:  finger-nose-finger, fine finger movements normal  REFLEXES:  deep tendon reflexes present and symmetric  GAIT/STATION:  normal     DIAGNOSTIC DATA (LABS, IMAGING, TESTING) - I reviewed patient records, labs, notes, testing and imaging myself where available.  Lab Results  Component Value Date   WBC 4.6 02/13/2021   HGB 13.6 02/13/2021   HCT 40.2 02/13/2021   MCV 86.8 02/13/2021   PLT 308 02/13/2021      Component Value Date/Time   NA 137 02/13/2021 2053   K 3.9 02/13/2021 2053   CL 102 02/13/2021 2053  CO2 25 02/13/2021 2053   GLUCOSE 94 02/13/2021 2053   BUN 14 02/13/2021 2053   CREATININE 0.90 02/13/2021 2053   CALCIUM 8.7 (L) 02/13/2021 2053   PROT 7.4 02/13/2021 2053   ALBUMIN 4.4 02/13/2021 2053   AST 30 02/13/2021 2053   ALT 33 02/13/2021 2053   ALKPHOS 42 02/13/2021 2053   BILITOT 0.5 02/13/2021 2053   GFRNONAA >60 02/13/2021 2053   GFRAA >60 06/18/2019 1850   No results found for: "CHOL", "HDL", "LDLCALC", "LDLDIRECT", "TRIG", "CHOLHDL" No results found for: "HGBA1C" No results found for: "VITAMINB12" Lab Results  Component Value Date   TSH 2.38 11/14/2017    MRI Brain 11/23/2019 No evidence of acute intracranial abnormality Partially empty sella turcica, a nonspecific finding. Otherwise unremarkable MRI appearance of the brain. Paranasal sinus disease, as described. Left mastoid effusion. Trace fluid also present within the right mastoid air cells. Nonspecific prominence of the nasopharyngeal/adenoid soft tissues. ENT consultation and direct visualization recommended.   Head CT 02/13/2021 No acute intracranial process.  Routine EEG 03/04/21: Normal    ASSESSMENT AND PLAN  36 y.o. year old male with past medical history of anxiety and ADHD who is presenting with multiple  episodes of slurred speech lasting hours most of the time associated with alcohol intake, there are also episodes that are not associated with alcohol intake.  He did have MRI and CT head which was negative for acute stroke.  During these episodes also wife also noted irritability and patient being argumentative which is not his personality.  His routine EEG was negative and is unable to obtain ambulatory EEG.  These episodes are happening weekly.  At this time I think is reasonable to start medication, I will start him on lamotrigine 25 mg daily, titrated up to 100 mg daily.  I will also obtain a 2-hour prolonged EEG.  We discussed side effect of the medication including insomnia, headaches and rash and advised the patient to stop the medication and contact me if he develop any rash.  I will see him in 3 months for follow-up if the episodes continue, will increase the medication to 100 mg twice daily.    1. Altered mental status, unspecified altered mental status type   2. Slurred speech      Patient Instructions  Two hours prolonged EEG, I will contact you to go over the result Start with lamotrigine 25 mg daily for 1 week, then increase to 50 mg daily for 1 week, then increase to 75 mg daily for 1 week, then increase to 100 mg daily, can increase further if still experiencing symptoms.  Follow-up in 29-months  Orders Placed This Encounter  Procedures   Adult EEG prolonged 61-120 minutes    Meds ordered this encounter  Medications   lamoTRIgine (LAMICTAL) 25 MG tablet    Sig: Take 1 tablet (25 mg total) by mouth daily for 7 days, THEN 1 tablet (25 mg total) daily for 7 days, THEN 3 tablets (75 mg total) daily for 7 days, THEN 4 tablets (100 mg total) daily.    Dispense:  180 tablet    Refill:  0    Return in about 3 months (around 11/11/2021).    Windell Norfolk, MD 08/11/2021, 10:47 AM  [facility] [address], [address] [place], [place] [address] [phone]

## 2021-08-12 ENCOUNTER — Ambulatory Visit: Payer: 59 | Admitting: Mental Health

## 2021-08-25 ENCOUNTER — Ambulatory Visit: Payer: 59 | Admitting: Neurology

## 2021-08-26 ENCOUNTER — Encounter: Payer: Self-pay | Admitting: Neurology

## 2021-08-28 ENCOUNTER — Ambulatory Visit (INDEPENDENT_AMBULATORY_CARE_PROVIDER_SITE_OTHER): Payer: 59 | Admitting: Mental Health

## 2021-08-28 ENCOUNTER — Encounter: Payer: Self-pay | Admitting: Neurology

## 2021-08-28 DIAGNOSIS — F909 Attention-deficit hyperactivity disorder, unspecified type: Secondary | ICD-10-CM | POA: Diagnosis not present

## 2021-08-28 NOTE — Progress Notes (Signed)
[facility] Counselor/therapist psychotherapy note  Name: Craig Fletcher Date: 08/28/21 [id number] DOB: 1985/05/09 PCP: Creola Corn, MD  Time spent:  52 minutes   Treatment: individual therapy  Virtual Visit via Telehealth Note Connected with patient by a telemedicine/telehealth application, with their informed consent, and verified patient privacy and that I am speaking with the correct person using two identifiers. I discussed the limitations, risks, security and privacy concerns of performing psychotherapy and the availability of in person appointments. I also discussed with the patient that there may be a patient responsible charge related to this service. The patient expressed understanding and agreed to proceed. I discussed the treatment planning with the patient. The patient was provided an opportunity to ask questions and all were answered. The patient agreed with the plan and demonstrated an understanding of the instructions. The patient was advised to call  our office if  symptoms worsen or feel they are in a crisis state and need immediate contact.   Therapist Location: office Patient Location: home    Mental Status Exam:    Appearance:   casual  Behavior:  WNL  /Motor:  WNL  Speech/Language:   Clear and Coherent  Affect:  Full range  Mood:  Euthymic  Thought process:  normal  Thought content:    WNL  Sensory/Perceptual disturbances:    WNL  Orientation:  x4  Attention:  Good  Concentration:  Good  Memory:  WNL  Fund of knowledge:   Good  Insight:    Good  Judgment:   Good  Impulse Control:  Good   Reported Symptoms:  Anxious, irritability, distractible, sadness, distractible, some problems w/ focus  Risk Assessment: Danger to Self:  No Self-injurious Behavior: No Danger to Others: No Duty to Warn:no Physical Aggression / Violence:No  Access to Firearms a concern: No  Gang Involvement:No  Patient / guardian was educated about steps to take if suicide or  homicide risk level increases between visits: yes While future psychiatric events cannot be accurately predicted, the patient does not currently require acute inpatient psychiatric care and does not currently meet [facility] involuntary commitment criteria.    Subjective:   Patient engaged in telehealth video visit.  Patient continues to share ongoing relational stress in his marriage.  He went on to share specifics related to his efforts in communication and various aspects of the relationship which include parenting their son.  Facilitated patient identifying and processing feelings related, needs as well as utilizing motivational interviewing to explore with patient future needs.  Patient plans to continue to work on being mindful of his emotions, identifying them as opposed to reacting to them and communicating with his wife utilizing active listening.    Interventions: Communication strategies, supportive approaches, motivational interviewing  Diagnoses:  Adjustment disorder: AD/HD   Individualized Plan of Care:   1. Patient to engage in family psychotherapy 2-4 times a month or as needed.  2. Patient is to use communication, coping skills to improve family relationships.    3.  Patient will identify when he needs time to remove himself from a situation in which he may make comments that are hurtful.    4. Patient/family to contact this office, go to the local ED or call 911 if a crisis or emergency develops between visits.  Progress: Progressing   [name], [facility]

## 2021-08-31 ENCOUNTER — Other Ambulatory Visit: Payer: Self-pay | Admitting: Neurology

## 2021-08-31 MED ORDER — LAMOTRIGINE 100 MG PO TABS: 100.0000 mg | ORAL_TABLET | Freq: Every day | ORAL | 11 refills | Status: DC

## 2021-09-02 ENCOUNTER — Ambulatory Visit: Payer: 59 | Admitting: Neurology

## 2021-09-02 ENCOUNTER — Telehealth: Payer: Self-pay | Admitting: Neurology

## 2021-09-02 DIAGNOSIS — R4182 Altered mental status, unspecified: Secondary | ICD-10-CM | POA: Diagnosis not present

## 2021-09-02 NOTE — Procedures (Signed)
    History:  36 year old man with altered mental status   EEG classification:  Awake and asleep Start time: 0941 End Time: 1143  Description of the recording: The background rhythms of this recording consists of a fairly well modulated medium amplitude background activity of 9 Hz. As the record progresses, the patient initially is in the waking state, but appears to enter the early stage II sleep during the recording, with rudimentary sleep spindles and vertex sharp wave activity seen. During the wakeful state, photic stimulation is performed, and no abnormal responses were seen. Hyperventilation was also performed, no abnormal response seen. No epileptiform discharges seen during this recording. There was no focal slowing. EKG monitor shows no evidence of cardiac rhythm abnormalities with a heart rate of 60.  Abnormality: None   Impression: This is a normal EEG recording in the waking and sleeping state. No evidence interictal epileptiform discharges were seen at any time during the recording.  A normal EEG does not exclude a diagnosis of epilepsy.    Windell Norfolk, MD [facility]

## 2021-09-02 NOTE — Telephone Encounter (Signed)
Pt wife Morrie Sheldon) called and stated she she had three missed called from this number and Pt had a test done today. Morrie Sheldon would like a call from nurse to follow up on test.

## 2021-09-03 NOTE — Telephone Encounter (Signed)
I called the patient. He had already been informed of the results. He appreciated [place] checking with him.

## 2021-09-03 NOTE — Telephone Encounter (Signed)
Yes, I have left her a message telling her that [place] EEG was normal.

## 2021-09-07 ENCOUNTER — Other Ambulatory Visit: Payer: Self-pay | Admitting: Neurology

## 2021-09-09 ENCOUNTER — Ambulatory Visit (INDEPENDENT_AMBULATORY_CARE_PROVIDER_SITE_OTHER): Payer: Self-pay | Admitting: Psychiatry

## 2021-09-09 DIAGNOSIS — Z91199 Patient's noncompliance with other medical treatment and regimen due to unspecified reason: Secondary | ICD-10-CM

## 2021-09-09 NOTE — Progress Notes (Signed)
Pt did not show for scheduled apt.  

## 2021-10-08 ENCOUNTER — Telehealth: Payer: Self-pay | Admitting: Neurology

## 2021-10-08 NOTE — Telephone Encounter (Signed)
LVM and sent mychart msg asking pt to call back to reschedule 9/27 appointment - MD out

## 2021-11-02 ENCOUNTER — Other Ambulatory Visit: Payer: 59

## 2021-11-02 ENCOUNTER — Telehealth: Payer: Self-pay | Admitting: Neurology

## 2021-11-02 ENCOUNTER — Other Ambulatory Visit: Payer: Self-pay | Admitting: Neurology

## 2021-11-02 ENCOUNTER — Encounter: Payer: Self-pay | Admitting: Neurology

## 2021-11-02 DIAGNOSIS — R4182 Altered mental status, unspecified: Secondary | ICD-10-CM

## 2021-11-02 NOTE — Telephone Encounter (Signed)
I returned Ashley's call. She reports the pt's verbal behavior is escalating. She reports he has been more aggressive verbally with her and her son recently and now her son is refusing to stay at home. She reports the man at home is not her husband and she is at a loss of what to do. She denied several times that the pt is not physically abusing his son or her.   She confirms the pt is taking Lamotrigine 100 mg daily and consistent with his dosage. She is requesting a head scan to be ordered- she and I discussed the option of going to the ER and he refuses to go to the ER. She sts he has agreed to do out patient imaging.   She and I discussed also that he is due for his follow up and she sts she is unsure if he would agree to this due to his work schedule and not comprehending how his actions are harming his family.   She requested I send a note to Dr. April Manson for review. Ashley's number is [phone].

## 2021-11-02 NOTE — Telephone Encounter (Signed)
Wife has asked that this message be sent as urgent, she states things with pt have spiraled out of control and she is unable to be  in the same house as pt as of last night.  Please call.

## 2021-11-03 ENCOUNTER — Telehealth: Payer: Self-pay | Admitting: Neurology

## 2021-11-03 ENCOUNTER — Encounter: Payer: Self-pay | Admitting: Neurology

## 2021-11-03 NOTE — Telephone Encounter (Signed)
Pt's wife called stating the GI can not get him in until the middle to end of Oct. and she is wanting to know if his order can be sent to [facility] on [facility]. Please advise.

## 2021-11-04 ENCOUNTER — Telehealth: Payer: Self-pay | Admitting: Neurology

## 2021-11-04 ENCOUNTER — Ambulatory Visit
Admission: RE | Admit: 2021-11-04 | Discharge: 2021-11-04 | Disposition: A | Discharge status: Home / Self Care | Payer: 59 | Source: Ambulatory Visit | Attending: Neurology | Admitting: Neurology

## 2021-11-04 DIAGNOSIS — R4182 Altered mental status, unspecified: Secondary | ICD-10-CM | POA: Diagnosis not present

## 2021-11-04 NOTE — Progress Notes (Signed)
As per our phone conversation, all antibodies test are normal. Lets continue with Lamictal 150 mg for the next 3 months.   Dr. April Manson

## 2021-11-04 NOTE — Telephone Encounter (Signed)
Pt wife is calling. Stated she need the orders for CT Scan sent to [facility] today. Caryl Pina stated they called her with a sooner appointment but need to submit through insurance

## 2021-11-04 NOTE — Telephone Encounter (Signed)
I responded to their mychart message, I will send it to [facility].

## 2021-11-04 NOTE — Telephone Encounter (Signed)
[facility] [place] case #[id number]

## 2021-11-06 ENCOUNTER — Inpatient Hospital Stay: Admission: RE | Admit: 2021-11-06 | Payer: 59 | Source: Ambulatory Visit

## 2021-11-11 ENCOUNTER — Telehealth: Payer: Self-pay | Admitting: Neurology

## 2021-11-11 ENCOUNTER — Ambulatory Visit: Payer: 59 | Admitting: Neurology

## 2021-11-11 NOTE — Telephone Encounter (Signed)
Wife is asking for a call to clarify how pt is supposed to take the lamoTRIgine (LAMICTAL) 100 MG tablet

## 2021-11-11 NOTE — Telephone Encounter (Signed)
I left a VM for a return call.   Dr. Jabier Mutton note on 11/02/2021 says: "As per our phone conversation, all antibodies test are normal. Lets continue with Lamictal 150 mg for the next 3 months."

## 2021-11-12 NOTE — Telephone Encounter (Signed)
I spoke with the patient and informed him to take the Lamictal 150 mg for the next three months. He stated he briefly took Lamictal 200 mg and recently his wife had informed him that Dr. April Manson said to take Lamictal 150 mg. He has recently been taking the correct dosage and is tolerating the medication well. He verbalized understanding and expressed appreciation for the call. All questions answered.

## 2021-11-13 LAB — AUTOIMMUNE NEUROLOGY AB

## 2022-03-02 ENCOUNTER — Ambulatory Visit: Payer: 59 | Admitting: Psychiatry

## 2022-03-02 ENCOUNTER — Encounter: Payer: Self-pay | Admitting: Psychiatry

## 2022-03-02 DIAGNOSIS — F419 Anxiety disorder, unspecified: Secondary | ICD-10-CM

## 2022-03-02 DIAGNOSIS — F4323 Adjustment disorder with mixed anxiety and depressed mood: Secondary | ICD-10-CM | POA: Diagnosis not present

## 2022-03-02 MED ORDER — VORTIOXETINE HBR 5 MG PO TABS
ORAL_TABLET | ORAL | 0 refills | Status: AC
Start: 2022-03-02 — End: 2022-04-05

## 2022-03-02 MED ORDER — VORTIOXETINE HBR 10 MG PO TABS: 10.0000 mg | ORAL_TABLET | Freq: Every day | ORAL | 1 refills | Status: DC

## 2022-03-02 NOTE — Progress Notes (Signed)
Craig Fletcher [id number] 1985/03/15 37 y.o.  Subjective:   Patient ID:  Craig Fletcher is a 37 y.o. (DOB 1985/08/20) male.  Chief Complaint:  Chief Complaint  Patient presents with   Anxiety   Other    Irritation    HPI Craig Fletcher presents to the office today for follow-up of depression and anxiety. He reports, "just always busy." He reports some stress with staying busy and some financial stress in response to multiple unexpected expenses in the last year (vehicle repairs, vet bills, etc.). He reports that he has been "more on edge, not as patient as I would like to be."  He reports that he was started on Lamotrigine due to possible seizure-like activity. He reports that Lamotrigine has helped some for motivation, energy, and concentration. He reports that he stopped taking Prozac due to limited improvement. He reports some depression. He reports some mood lability. He reports that he only takes Melatonin on rare occasions when he is unable to sleep due to anxious thoughts. Sleep is adequate overall. He reports that he has "not been eating as healthy." He reports that they have recently changed their diet since son was having some excessive somnolence at school and was found to have high cholesterol. He reports that his concentration varies from day to day. He reports that he typically is able to focus at work without difficulty. Denies SI.   Step-father has dementia that is progressing. He reports trying to see them and call them when his schedule allows. Reports that he has always had a good relationship with his step-father and would like to spend time with him as much as possible.   He has reduced part-time work since this was getting to bee "too much," and this has reduced his discretionary income. 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   He reports rare ETOH use since he thinks ETOH disrupts sleep quality.   Past Psychiatric  Medication Trials: Wellbutrin- Helpful for concentration. He reports that it has helped some with depression. Reports that wife thinks this causes him to be "on edge." Was on 300 mg. Lexapro- Sexual side effects. Was on 20 mg and was reduced to 10 mg po qd this past month. Side effects have improved some at lower dose. Ineffective.  Sertraline- Sexual side effects Prozac- Did not think this was beneficial. Possible sexual side effects.  Buspar Adderall XR- Had to be doing something all the time and was "overly focused." Had difficulty sleeping at higher dose. Stopped 5-6 years Adderall- Had insomnia and increased energy Strattera- did not start due to cost Propranolol- Ineffective Guanfacine- Has helped with anxiety and has helped some with concentration. Lamictal   GAD-7    Flowsheet Row Office Visit from 06/01/2019 in [place] at [facility] Visit from 07/05/2018 in [place] at [facility] Visit from 05/31/2018 in [place] at [facility] Visit from 12/16/2017 in [place] at [facility] Visit from 11/11/2017 in [place] at [facility]  Total GAD-7 Score 6 9 7 8 11       [place] Visit from 07/05/2018 in [place] at [facility] Visit from 05/31/2018 in [place] at [facility] Visit from 12/16/2017 in [place] at [facility] Visit from 11/11/2017 in [place] at [facility] Total Score 3 4 2  3  PHQ-9 Total Score 10 6 7 12       Flowsheet Row ED from 02/13/2021 in [facility]  C-SSRS RISK CATEGORY No Risk        Review of Systems:  Review of Systems  Musculoskeletal:  Negative for gait problem.  Skin:  Negative for rash.  Neurological:  Negative for syncope and headaches.       Reports that he was started on Lamictal for possible seizure-like activity. He reports that episodes have  occurred only at home and he thinks that episodes may be related to dehydration. He reports that he had one day where wife was concerned about possible seizure-like activity when he ran multiple errands and drove over 300 miles and later did not recall he had been to a certain store.   Psychiatric/Behavioral:         Please refer to HPI    Medications: I have reviewed the patient's current medications.  Current Outpatient Medications  Medication Sig Dispense Refill   ibuprofen (ADVIL) 800 MG tablet Take 1 tablet (800 mg total) by mouth every 8 (eight) hours as needed. 30 tablet 0   lamoTRIgine (LAMICTAL) 100 MG tablet Take 1 tablet (100 mg total) by mouth daily. (Patient taking differently: Take 150 mg by mouth daily.) 30 tablet 11   melatonin 5 MG TABS Take 5 mg by mouth at bedtime as needed.     Misc Natural Products (ELDERBERRY IMMUNE COMPLEX PO) Take by mouth.     vitamin B-12 (CYANOCOBALAMIN) 1000 MCG tablet Take 1,000 mcg by mouth daily.     Vitamin D, Ergocalciferol, (DRISDOL) 1.25 MG (50000 UNIT) CAPS capsule Take 50,000 Units by mouth once a week.     vortioxetine HBr (TRINTELLIX) 10 MG TABS tablet Take 1 tablet (10 mg total) by mouth daily. 30 tablet 1   No current facility-administered medications for this visit.    Medication Side Effects: None  Allergies: No Known Allergies  Past Medical History:  Diagnosis Date   ADHD    Anxiety     Past Medical History, Surgical history, Social history, and Family history were reviewed and updated as appropriate.   Please see review of systems for further details on the patient's review from today.   Objective:   Physical Exam:  There were no vitals taken for this visit.  Physical Exam Constitutional:      General: He is not in acute distress. Musculoskeletal:        General: No deformity.  Neurological:     Mental Status: He is alert and oriented to person, place, and time.     Coordination: Coordination normal.   Psychiatric:        Attention and Perception: Attention and perception normal. He does not perceive auditory or visual hallucinations.        Mood and Affect: Mood is anxious. Affect is not labile, blunt, angry or inappropriate.        Speech: Speech normal.        Behavior: Behavior normal.        Thought Content: Thought content normal. Thought content is not paranoid or delusional. Thought content does not include homicidal or suicidal ideation. Thought content does not include homicidal or suicidal plan.        Cognition and Memory: Cognition and memory normal.        Judgment: Judgment normal.     Comments: Insight intact Mood is mildly depressed     Lab Review:  Component Value Date/Time   NA 137 02/13/2021 2053   K 3.9 02/13/2021 2053   CL 102 02/13/2021 2053   CO2 25 02/13/2021 2053   GLUCOSE 94 02/13/2021 2053   BUN 14 02/13/2021 2053   CREATININE 0.90 02/13/2021 2053   CALCIUM 8.7 (L) 02/13/2021 2053   PROT 7.4 02/13/2021 2053   ALBUMIN 4.4 02/13/2021 2053   AST 30 02/13/2021 2053   ALT 33 02/13/2021 2053   ALKPHOS 42 02/13/2021 2053   BILITOT 0.5 02/13/2021 2053   GFRNONAA >60 02/13/2021 2053   GFRAA >60 06/18/2019 1850       Component Value Date/Time   WBC 4.6 02/13/2021 2053   RBC 4.63 02/13/2021 2053   HGB 13.6 02/13/2021 2053   HCT 40.2 02/13/2021 2053   PLT 308 02/13/2021 2053   MCV 86.8 02/13/2021 2053   MCH 29.4 02/13/2021 2053   MCHC 33.8 02/13/2021 2053   RDW 13.3 02/13/2021 2053   LYMPHSABS 2.1 02/13/2021 2053   MONOABS 0.4 02/13/2021 2053   EOSABS 0.1 02/13/2021 2053   BASOSABS 0.0 02/13/2021 2053    No results found for: "POCLITH", "LITHIUM"   No results found for: "PHENYTOIN", "PHENOBARB", "VALPROATE", "CBMZ"   .res Assessment: Plan:   Pt seen for 30 minutes and time spent discussing treatment options for anxiety and depression. Discussed potential benefits, risks, and side effects of Trintellix and pt agrees to trial of  Trintellix. Will start Trintellix 5 mg daily with food for one week, then increase to 10 mg daily to improve mood and anxiety symptoms.  He has follow-up apt with neurologist in March and plans to discuss possible increase in Lamictal at that time.  Pt to follow-up with this provider in 6 weeks or sooner if clinically indicated.  Recommend continuing psychotherapy with Lanetta Inch, [facility].  Patient advised to contact office with any questions, adverse effects, or acute worsening in signs and symptoms.     Craig Fletcher was seen today for anxiety and other.  Diagnoses and all orders for this visit:  Anxiety disorder, unspecified type -     vortioxetine HBr (TRINTELLIX) 10 MG TABS tablet; Take 1 tablet (10 mg total) by mouth daily.  Adjustment disorder with mixed anxiety and depressed mood -     vortioxetine HBr (TRINTELLIX) 10 MG TABS tablet; Take 1 tablet (10 mg total) by mouth daily.     Please see After Visit Summary for patient specific instructions.  Future Appointments  Date Time Provider [place]  04/13/2022  8:30 AM Thayer Headings, PMHNP CP-CP None     No orders of the defined types were placed in this encounter.   -------------------------------

## 2022-03-22 ENCOUNTER — Telehealth: Payer: Self-pay | Admitting: Psychiatry

## 2022-03-22 ENCOUNTER — Telehealth: Payer: Self-pay

## 2022-03-22 ENCOUNTER — Other Ambulatory Visit: Payer: Self-pay | Admitting: Neurology

## 2022-03-22 ENCOUNTER — Telehealth: Payer: Self-pay | Admitting: Neurology

## 2022-03-22 MED ORDER — LAMOTRIGINE 200 MG PO TABS: 200.0000 mg | ORAL_TABLET | Freq: Every day | ORAL | 6 refills | Status: DC

## 2022-03-22 NOTE — Telephone Encounter (Signed)
Pt stated he has been having side effects from trintellix.He has been struggling with mood/irritability.Also having some depression,some days he is ok and others he is not.He feels like trintellix put him in a worse state then before he was when he was not taking it.

## 2022-03-22 NOTE — Telephone Encounter (Addendum)
Ok to go up to 200mg  daily. I will send a new prescription.

## 2022-03-22 NOTE — Telephone Encounter (Signed)
Sent mc message.

## 2022-03-22 NOTE — Telephone Encounter (Signed)
Pt is calling. Stated him and Dr. April Manson talked about increasing medication lamoTRIgine (LAMICTAL) 100 MG tablet. Pt said he is still having some symptoms and would like medication increase.

## 2022-03-22 NOTE — Telephone Encounter (Signed)
Patient lvm stating that he is getting off Trintellix. He would like to know if there is another medicine that will better for him as the Trintellix is "not good for him." Appt 2/27 Ph: [phone]

## 2022-03-22 NOTE — Telephone Encounter (Signed)
How long ago did he stop Trintellix? Please let him know that it has a long-half life and it may take 2-3 weeks after stopping it before the side effects resolve. It looks like neurology increased Lamictal today to 200 mg po qd. Recommend waiting a few weeks for Trintellix to wash out and to see the effects of the increase in Lamictal since it may help with depression and irritability.

## 2022-03-23 NOTE — Telephone Encounter (Signed)
For our record he stopped trintellix a few days ago

## 2022-04-13 ENCOUNTER — Encounter: Payer: Self-pay | Admitting: Psychiatry

## 2022-04-13 ENCOUNTER — Ambulatory Visit (INDEPENDENT_AMBULATORY_CARE_PROVIDER_SITE_OTHER): Payer: 59 | Admitting: Psychiatry

## 2022-04-13 DIAGNOSIS — F419 Anxiety disorder, unspecified: Secondary | ICD-10-CM | POA: Diagnosis not present

## 2022-04-13 DIAGNOSIS — F909 Attention-deficit hyperactivity disorder, unspecified type: Secondary | ICD-10-CM | POA: Diagnosis not present

## 2022-04-13 NOTE — Progress Notes (Signed)
Craig Fletcher [id number] 1985/06/01 37 y.o.  Virtual Visit via Telephone Note  I connected with pt on 04/13/22 at  8:30 AM EST by telephone and verified that I am speaking with the correct person using two identifiers.   I discussed the limitations, risks, security and privacy concerns of performing an evaluation and management service by telephone and the availability of in person appointments. I also discussed with the patient that there may be a patient responsible charge related to this service. The patient expressed understanding and agreed to proceed.   I discussed the assessment and treatment plan with the patient. The patient was provided an opportunity to ask questions and all were answered. The patient agreed with the plan and demonstrated an understanding of the instructions.   The patient was advised to call back or seek an in-person evaluation if the symptoms worsen or if the condition fails to improve as anticipated.  I provided 20 minutes of non-face-to-face time during this encounter.  The patient was located at home.  The provider was located at home.   Thayer Headings, PMHNP   Subjective:   Patient ID:  Craig Fletcher is a 37 y.o. (DOB Jun 20, 1985) male.  Chief Complaint: No chief complaint on file.   HPI MIMS MALTA presents for follow-up of depression and anxiety. He reports that increase in Lamictal to 200 mg daily has been helpful for his mood. He reports that he feels "way better... more focus, more energy... stress has been less." Reports improved motivation. He has been able to work out more and he has been eating healthier foods. He reports increased satiety with eating healthier foods. He has been cooking more at home and eating out less. He reports improved irritability. He reports that he is not "as quick to react." He reports, "I'm at peace more." He reports that he has been able to be more productive. Able to concentrate and has been reading about nutrition.  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 Denies SI.   He has been doing outdoor activities and exercises with his son. Helping mother and step-father. He has been reducing social media. "I focus more on what I can control and less and what I can't." He reports improved interactions with son and wife.   Denies any recent possible seizure activity. Reports wife has not commented on slurred speech since increase in Lamictal to 200 mg qd.   Past Psychiatric Medication Trials: Wellbutrin- Helpful for concentration. He reports that it has helped some with depression. Reports that wife thinks this causes him to be "on edge." Was on 300 mg. Lexapro- Sexual side effects. Was on 20 mg and was reduced to 10 mg po qd this past month. Side effects have improved some at lower dose. Ineffective.  Sertraline- Sexual side effects Prozac- Did not think this was beneficial. Possible sexual side effects.  Trintellix- He reports increased irritability. Buspar Adderall XR- Had to be doing something all the time and was "overly focused." Had difficulty sleeping at higher dose. Stopped 5-6 years Adderall- Had insomnia and increased energy Strattera- did not start due to cost Propranolol- Ineffective Guanfacine- Has helped with anxiety and has helped some with concentration. Lamictal    Review of Systems:  Review of Systems  Musculoskeletal:  Negative for gait problem.  Skin:  Negative for rash.  Neurological:  Negative for seizures and speech difficulty.  Psychiatric/Behavioral:         Please refer to HPI    Medications: I have reviewed the patient's current medications.  Current Outpatient Medications  Medication Sig Dispense Refill   ibuprofen (ADVIL) 800 MG tablet Take 1 tablet (800 mg total) by mouth every 8 (eight) hours as needed. 30 tablet 0   lamoTRIgine (LAMICTAL) 200 MG tablet Take 1 tablet (200 mg  total) by mouth daily. 30 tablet 6   Misc Natural Products (ELDERBERRY IMMUNE COMPLEX PO) Take by mouth.     vitamin B-12 (CYANOCOBALAMIN) 1000 MCG tablet Take 1,000 mcg by mouth daily.     Vitamin D, Ergocalciferol, (DRISDOL) 1.25 MG (50000 UNIT) CAPS capsule Take 50,000 Units by mouth once a week.     melatonin 5 MG TABS Take 5 mg by mouth at bedtime as needed. (Patient not taking: Reported on 04/13/2022)     No current facility-administered medications for this visit.    Medication Side Effects: None  Allergies: No Known Allergies  Past Medical History:  Diagnosis Date   ADHD    Anxiety     Family History  Problem Relation Age of Onset   Depression Mother    Alcohol abuse Father     Social History   Socioeconomic History   Marital status: Married    Spouse name: Not on file   Number of children: Not on file   Years of education: Not on file   Highest education level: Not on file  Occupational History   Not on file  Tobacco Use   Smoking status: Never   Smokeless tobacco: Never  Substance and Sexual Activity   Alcohol use: Not Currently    Comment: rare/occ   Drug use: No   Sexual activity: Not on file  Other Topics Concern   Not on file  Social History Narrative   Not on file   Social Determinants of Health   Financial Resource Strain: Not on file  Food Insecurity: Not on file  Transportation Needs: Not on file  Physical Activity: Not on file  Stress: Not on file  Social Connections: Not on file  Intimate Partner Violence: Not on file    Past Medical History, Surgical history, Social history, and Family history were reviewed and updated as appropriate.   Please see review of systems for further details on the patient's review from today.   Objective:   Physical Exam:  There were no vitals taken for this visit.  Physical Exam Constitutional:      General: He is not in acute distress. Musculoskeletal:        General: No deformity.   Neurological:     Mental Status: He is alert and oriented to person, place, and time.     Coordination: Coordination normal.  Psychiatric:        Attention and Perception: Attention and perception normal. He does not perceive auditory or visual hallucinations.        Mood and Affect: Mood normal. Mood is not anxious or depressed. Affect is not labile, blunt, angry or inappropriate.        Speech: Speech normal.        Behavior: Behavior normal.        Thought Content: Thought content normal. Thought content is not paranoid or delusional. Thought content does not include homicidal or suicidal ideation. Thought content does not include homicidal or suicidal plan.        Cognition and Memory: Cognition and memory normal.  Judgment: Judgment normal.     Comments: Insight intact     Lab Review:     Component Value Date/Time   NA 137 02/13/2021 2053   K 3.9 02/13/2021 2053   CL 102 02/13/2021 2053   CO2 25 02/13/2021 2053   GLUCOSE 94 02/13/2021 2053   BUN 14 02/13/2021 2053   CREATININE 0.90 02/13/2021 2053   CALCIUM 8.7 (L) 02/13/2021 2053   PROT 7.4 02/13/2021 2053   ALBUMIN 4.4 02/13/2021 2053   AST 30 02/13/2021 2053   ALT 33 02/13/2021 2053   ALKPHOS 42 02/13/2021 2053   BILITOT 0.5 02/13/2021 2053   GFRNONAA >60 02/13/2021 2053   GFRAA >60 06/18/2019 1850       Component Value Date/Time   WBC 4.6 02/13/2021 2053   RBC 4.63 02/13/2021 2053   HGB 13.6 02/13/2021 2053   HCT 40.2 02/13/2021 2053   PLT 308 02/13/2021 2053   MCV 86.8 02/13/2021 2053   MCH 29.4 02/13/2021 2053   MCHC 33.8 02/13/2021 2053   RDW 13.3 02/13/2021 2053   LYMPHSABS 2.1 02/13/2021 2053   MONOABS 0.4 02/13/2021 2053   EOSABS 0.1 02/13/2021 2053   BASOSABS 0.0 02/13/2021 2053    No results found for: "POCLITH", "LITHIUM"   No results found for: "PHENYTOIN", "PHENOBARB", "VALPROATE", "CBMZ"   .res Assessment: Plan:   Pt seen for 20 minutes and time spent discussing response to med  changes since last visit and life style changes in terms of diet, exercise, and new sleep schedule.  Will continue current plan of care since target signs and symptoms are well controlled without any tolerability issues. He reports improved mood since Lamictal was increased by neurology provider to 200 mg daily.  He denies any acute concerns with mood or anxiety, and therefore no new medications are recommended at this time. Pt to follow-up with this provider in 8 weeks or sooner if clinically indicated.  Patient advised to contact office with any questions, adverse effects, or acute worsening in signs and symptoms.   Diagnoses and all orders for this visit:  Anxiety disorder, unspecified type  Attention deficit hyperactivity disorder (ADHD), unspecified ADHD type    Please see After Visit Summary for patient specific instructions.  No future appointments.   No orders of the defined types were placed in this encounter.     -------------------------------

## 2022-06-09 ENCOUNTER — Encounter: Payer: Self-pay | Admitting: Psychiatry

## 2022-06-09 ENCOUNTER — Ambulatory Visit (INDEPENDENT_AMBULATORY_CARE_PROVIDER_SITE_OTHER): Payer: 59 | Admitting: Psychiatry

## 2022-06-09 DIAGNOSIS — F419 Anxiety disorder, unspecified: Secondary | ICD-10-CM | POA: Diagnosis not present

## 2022-06-09 NOTE — Progress Notes (Signed)
Craig Fletcher [id number] July 25, 1985 37 y.o.  Virtual Visit via Telephone Note  I connected with pt on 06/09/22 at 10:30 AM EDT by telephone and verified that I am speaking with the correct person using two identifiers.   I discussed the limitations, risks, security and privacy concerns of performing an evaluation and management service by telephone and the availability of in person appointments. I also discussed with the patient that there may be a patient responsible charge related to this service. The patient expressed understanding and agreed to proceed.   I discussed the assessment and treatment plan with the patient. The patient was provided an opportunity to ask questions and all were answered. The patient agreed with the plan and demonstrated an understanding of the instructions.   The patient was advised to call back or seek an in-person evaluation if the symptoms worsen or if the condition fails to improve as anticipated.  I provided 30 minutes of non-face-to-face time during this encounter.  The patient was located at his mother's house in [place].  The provider was located at [facility].   Corie Chiquito, PMHNP   Subjective:   Patient ID:  Craig Fletcher is a 37 y.o. (DOB [birth year]) male.  Chief Complaint:  Chief Complaint  Patient presents with   Follow-up    Mood disturbance, anxiety, and ADHD    HPI Craig Fletcher presents for follow-up of mood disturbance, anxiety, and ADHD. He reports that he has been "much better" since increase in Lamictal. He reports improved mood symptoms. "Not having the mood swings" since increase in Lamictal. He reports not feeling "overwhelmed." He reports anxiety has improved overall. He reports occasional anxiety when there are multiple things occurring simultaneously. Denies depressed mood. Denies significant irritability. He reports feeling like he is able to "get more tasks done." He reports concentration is adequate if he minimizes  distractions. He reports adequate sleep overall. He reports that he had one night of disrupted sleep. He reports that his energy has been ok. Motivation has been "much better." He reports that he is not eating as well and plans to try to be more intentional about eating more healthy. Denies SI.   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 Son is active in baseball and a youth group at church.   Past Psychiatric Medication Trials: Wellbutrin- Helpful for concentration. He reports that it has helped some with depression. Reports that wife thinks this causes him to be "on edge." Was on 300 mg. Lexapro- Sexual side effects. Was on 20 mg and was reduced to 10 mg po qd this past month. Side effects have improved some at lower dose. Ineffective.  Sertraline- Sexual side effects Prozac- Did not think this was beneficial. Possible sexual side effects.  Trintellix- He reports increased irritability. Buspar Adderall XR- Had to be doing something all the time and was "overly focused." Had difficulty sleeping at higher dose. Stopped 5-6 years Adderall- Had insomnia and increased energy Strattera- did not start due to cost Propranolol- Ineffective Guanfacine- Has helped with anxiety and has helped some with concentration. Lamictal  Review of Systems:  Review of Systems  Musculoskeletal:  Negative for gait problem.  Skin:  Negative for rash.  Neurological:  Negative for seizures.  Psychiatric/Behavioral:         Please refer to HPI    Medications: I have reviewed the patient's current medications.  Current Outpatient Medications  Medication Sig Dispense Refill   lamoTRIgine (LAMICTAL) 200 MG tablet  Take 1 tablet (200 mg total) by mouth daily. 30 tablet 6   vitamin B-12 (CYANOCOBALAMIN) 1000 MCG tablet Take 1,000 mcg by mouth daily.     Vitamin D, Ergocalciferol, (DRISDOL) 1.25 MG (50000 UNIT) CAPS capsule Take 50,000 Units by mouth once a week.     ibuprofen (ADVIL) 800 MG tablet Take 1  tablet (800 mg total) by mouth every 8 (eight) hours as needed. 30 tablet 0   melatonin 5 MG TABS Take 5 mg by mouth at bedtime as needed. (Patient not taking: Reported on 04/13/2022)     Misc Natural Products (ELDERBERRY IMMUNE COMPLEX PO) Take by mouth.     No current facility-administered medications for this visit.    Medication Side Effects: None  Allergies: No Known Allergies  Past Medical History:  Diagnosis Date   ADHD    Anxiety     Family History  Problem Relation Age of Onset   Depression Mother    Alcohol abuse Father     Social History   Socioeconomic History   Marital status: Married    Spouse name: Not on file   Number of children: Not on file   Years of education: Not on file   Highest education level: Not on file  Occupational History   Not on file  Tobacco Use   Smoking status: Never   Smokeless tobacco: Never  Substance and Sexual Activity   Alcohol use: Not Currently    Comment: rare/occ   Drug use: No   Sexual activity: Not on file  Other Topics Concern   Not on file  Social History Narrative   Not on file   Social Determinants of Health   Financial Resource Strain: Not on file  Food Insecurity: Not on file  Transportation Needs: Not on file  Physical Activity: Not on file  Stress: Not on file  Social Connections: Not on file  Intimate Partner Violence: Not on file    Past Medical History, Surgical history, Social history, and Family history were reviewed and updated as appropriate.   Please see review of systems for further details on the patient's review from today.   Objective:   Physical Exam:  There were no vitals taken for this visit.  Physical Exam Neurological:     Mental Status: He is alert and oriented to person, place, and time.     Cranial Nerves: No dysarthria.  Psychiatric:        Attention and Perception: Attention and perception normal.        Mood and Affect: Mood normal.        Speech: Speech normal.         Behavior: Behavior is cooperative.        Thought Content: Thought content normal. Thought content is not paranoid or delusional. Thought content does not include homicidal or suicidal ideation. Thought content does not include homicidal or suicidal plan.        Cognition and Memory: Cognition and memory normal.        Judgment: Judgment normal.     Comments: Insight intact     Lab Review:     Component Value Date/Time   NA 137 02/13/2021 2053   K 3.9 02/13/2021 2053   CL 102 02/13/2021 2053   CO2 25 02/13/2021 2053   GLUCOSE 94 02/13/2021 2053   BUN 14 02/13/2021 2053   CREATININE 0.90 02/13/2021 2053   CALCIUM 8.7 (L) 02/13/2021 2053   PROT 7.4 02/13/2021 2053  ALBUMIN 4.4 02/13/2021 2053   AST 30 02/13/2021 2053   ALT 33 02/13/2021 2053   ALKPHOS 42 02/13/2021 2053   BILITOT 0.5 02/13/2021 2053   GFRNONAA >60 02/13/2021 2053   GFRAA >60 06/18/2019 1850       Component Value Date/Time   WBC 4.6 02/13/2021 2053   RBC 4.63 02/13/2021 2053   HGB 13.6 02/13/2021 2053   HCT 40.2 02/13/2021 2053   PLT 308 02/13/2021 2053   MCV 86.8 02/13/2021 2053   MCH 29.4 02/13/2021 2053   MCHC 33.8 02/13/2021 2053   RDW 13.3 02/13/2021 2053   LYMPHSABS 2.1 02/13/2021 2053   MONOABS 0.4 02/13/2021 2053   EOSABS 0.1 02/13/2021 2053   BASOSABS 0.0 02/13/2021 2053    No results found for: "POCLITH", "LITHIUM"   No results found for: "PHENYTOIN", "PHENOBARB", "VALPROATE", "CBMZ"   .res Assessment: Plan:    Patient seen for 30 minutes and time spent discussing recent mood and anxiety symptoms.  Patient reports that mood and anxiety symptoms have been significantly improved since increase in lamotrigine to 200 mg daily.  Patient denies any acute concerns at this time and reports that he would like to continue current plan without changes. Pt to follow-up in 6 months or sooner if clinically indicated.  Patient advised to contact office with any questions, adverse effects, or acute  worsening in signs and symptoms.   Elmond was seen today for follow-up.  Diagnoses and all orders for this visit:  Anxiety disorder, unspecified type    Please see After Visit Summary for patient specific instructions.  No future appointments.   No orders of the defined types were placed in this encounter.     -------------------------------

## 2022-07-23 IMAGING — CT CT HEAD W/O CM
3 series · 16 of 47 positions shown, 19 images · non-contrast
Comparison: No prior CT, correlation is made with MRI head
11/22/2020.

CLINICAL DATA: Delirium, slurred speech

EXAM:
CT HEAD WITHOUT CONTRAST
TECHNIQUE: Contiguous axial images were obtained from the base of the skull
through the vertex without intravenous contrast.

[Series 2: head wo · axial · 0.49mm/px · z∈[-213,-88]mm · 10 of 30 slices shown, 13 images]
[im 3/30  brain]
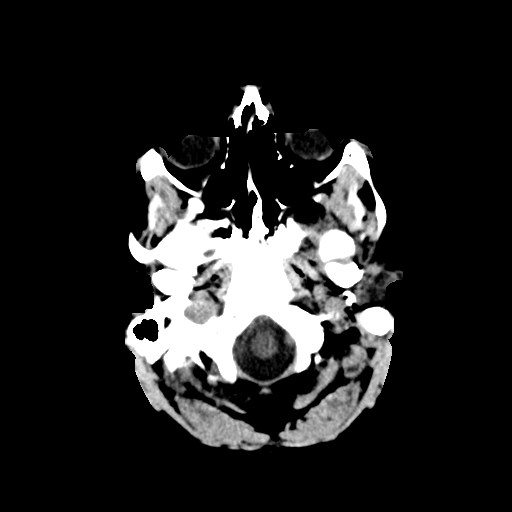
[im 3/30  bone]
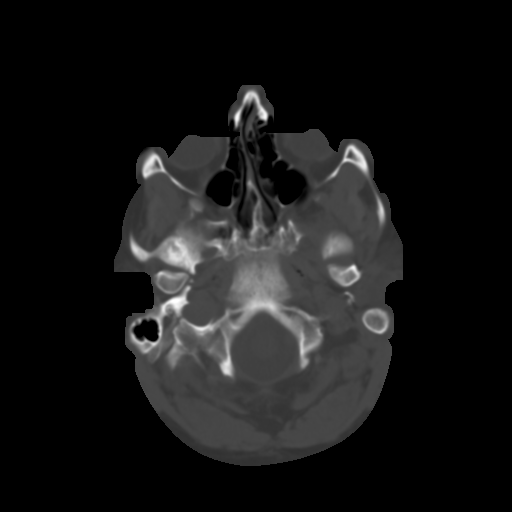
[im 6/30  brain]
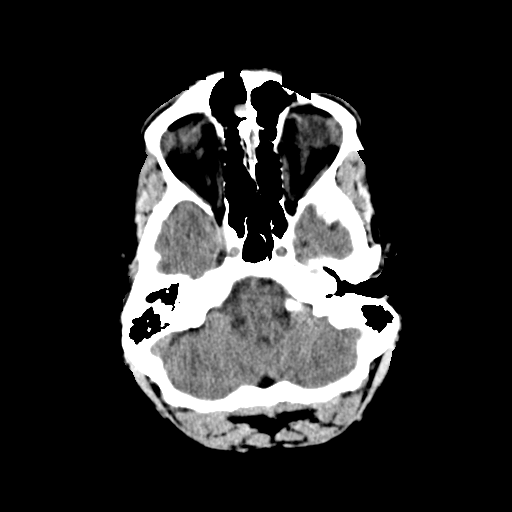
[im 9/30  brain]
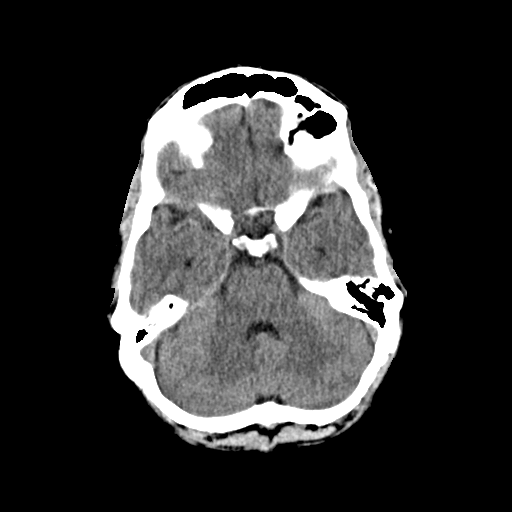
[im 11/30  brain]
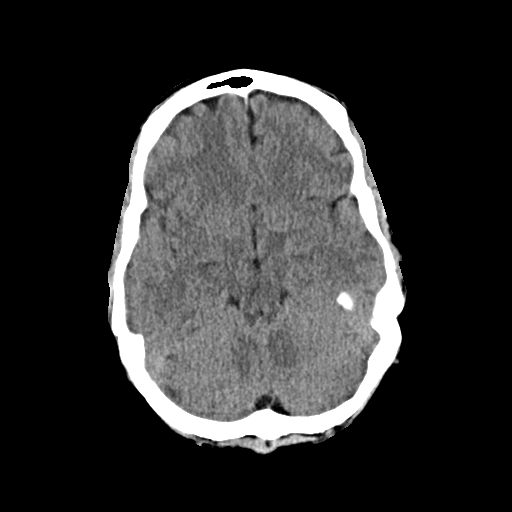
[im 14/30  brain]
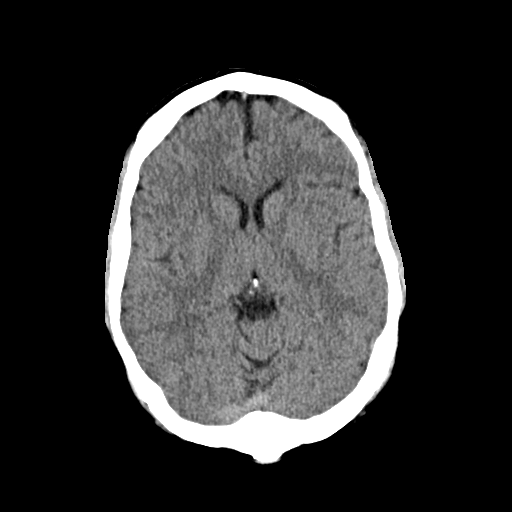
[im 14/30  bone]
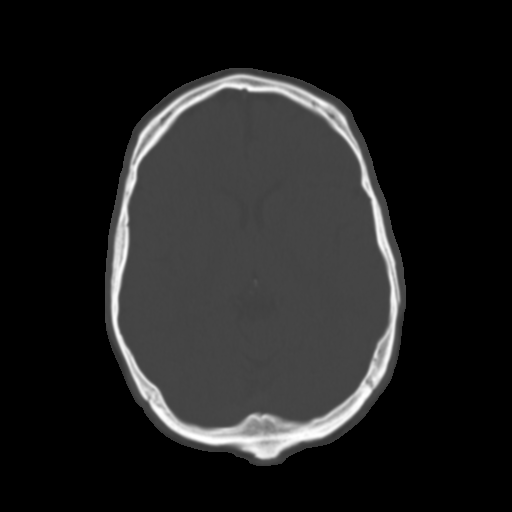
[im 17/30  brain]
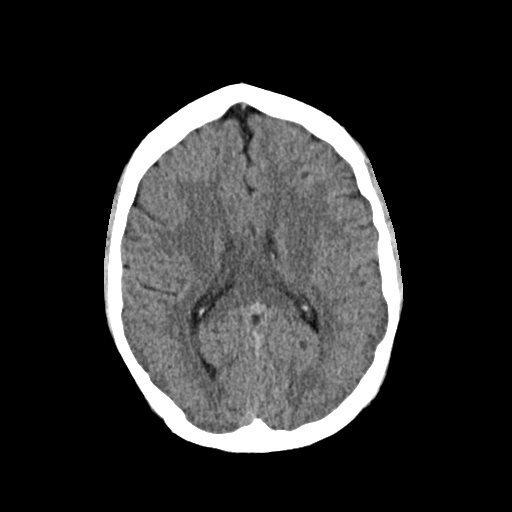
[im 20/30  brain]
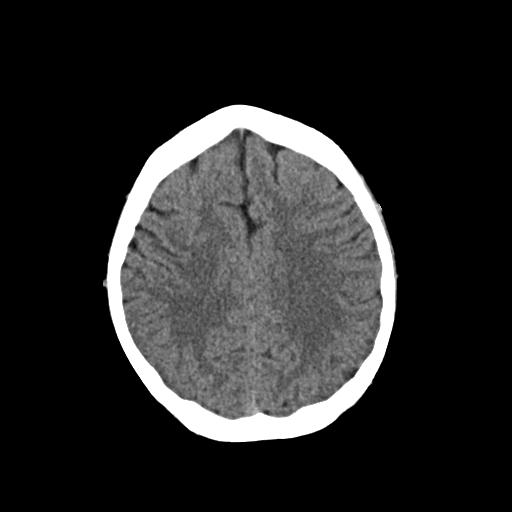
[im 23/30  brain]
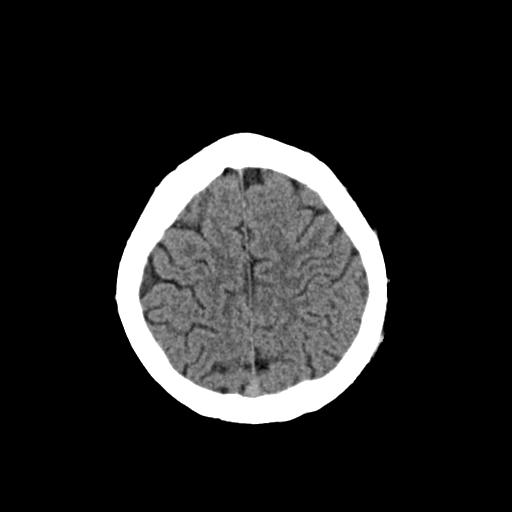
[im 25/30  brain]
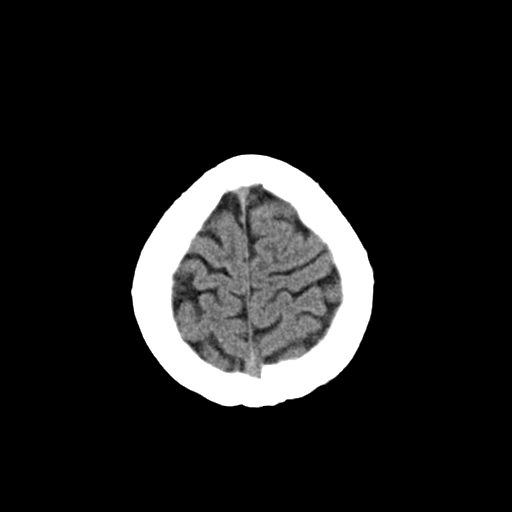
[im 25/30  bone]
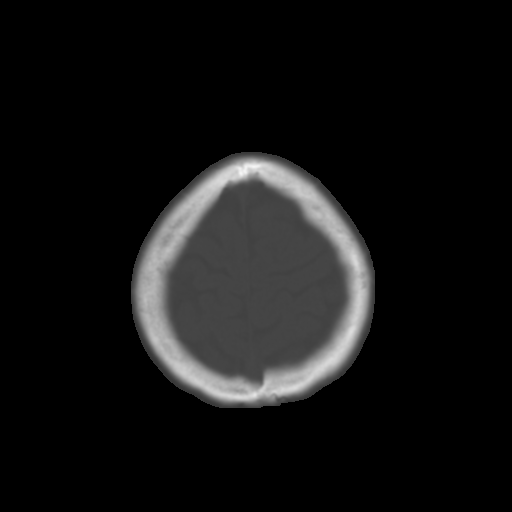
[im 28/30  brain]
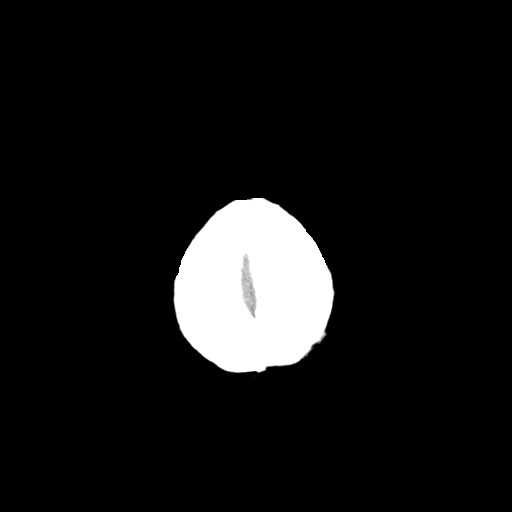

[Series 4: coronal soft · coronal · 0.37mm/px · 3 of 72 slices shown]
[im 24/72  brain]
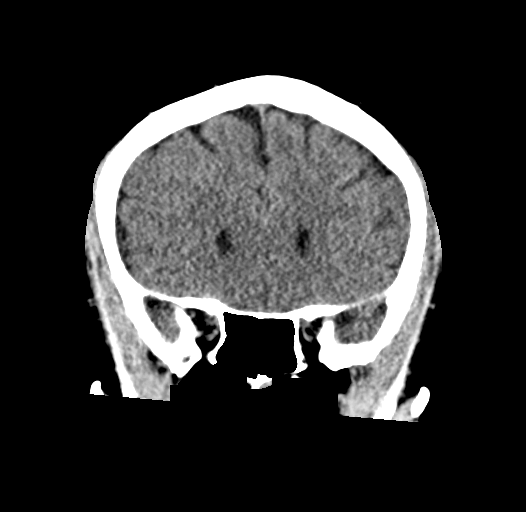
[im 32/72  brain]
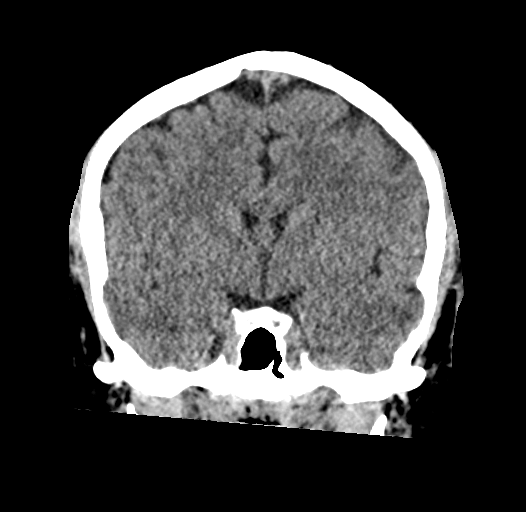
[im 40/72  brain]
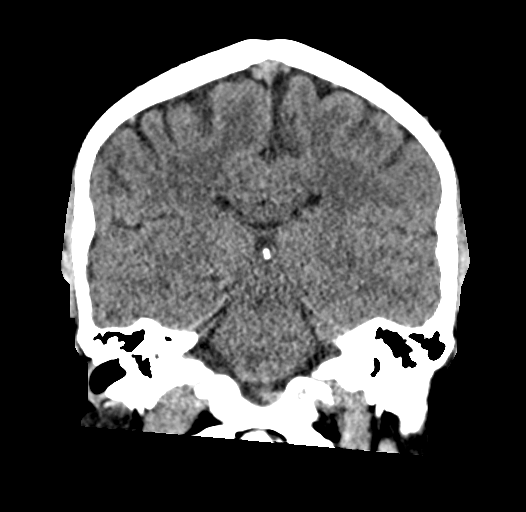

[Series 5: sag soft · sagittal · 0.37mm/px · 3 of 62 slices shown]
[im 21/62  brain]
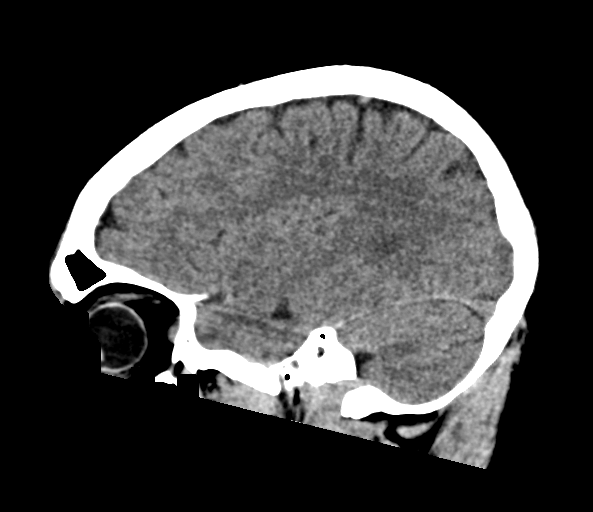
[im 31/62  brain]
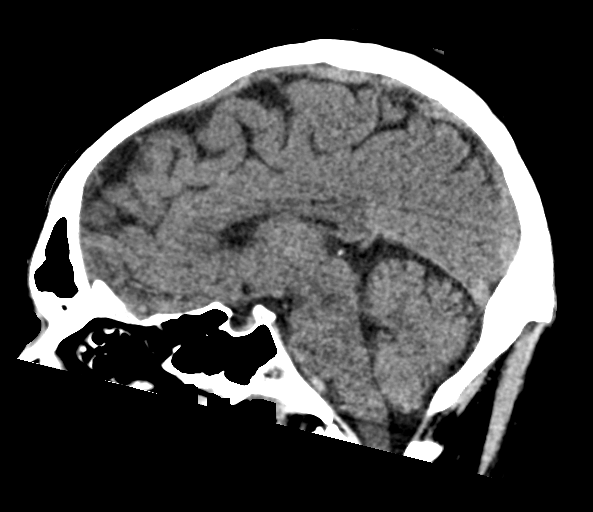
[im 41/62  brain]
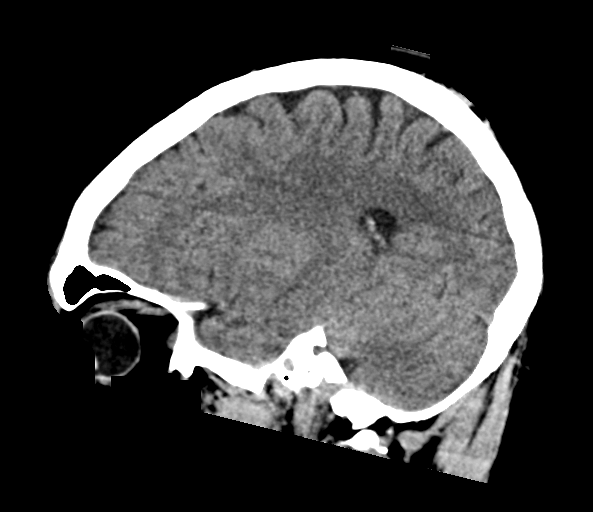

[16 of 47 positions shown; findings below may reference images not displayed]

FINDINGS: Brain: No evidence of acute infarction, hemorrhage, cerebral edema,
mass, mass effect, or midline shift. No hydrocephalus or extra-axial
fluid collection.

Vascular: No hyperdense vessel.

Skull: Normal. Negative for fracture or focal lesion.

Sinuses/Orbits: Mucous retention cysts in the right inferior frontal
sinus/anterior ethmoid air cells. The orbits are unremarkable.

Other: The mastoid air cells are well aerated.
IMPRESSION: IMPRESSION
No acute intracranial process.

## 2022-10-27 ENCOUNTER — Other Ambulatory Visit: Payer: Self-pay | Admitting: Neurology

## 2022-12-24 ENCOUNTER — Other Ambulatory Visit: Payer: Self-pay | Admitting: Neurology

## 2022-12-29 ENCOUNTER — Encounter: Payer: Self-pay | Admitting: Psychiatry

## 2023-01-22 ENCOUNTER — Other Ambulatory Visit: Payer: Self-pay | Admitting: Neurology

## 2023-02-14 ENCOUNTER — Ambulatory Visit: Payer: 59 | Admitting: Mental Health

## 2023-02-14 DIAGNOSIS — F419 Anxiety disorder, unspecified: Secondary | ICD-10-CM | POA: Diagnosis not present

## 2023-02-14 DIAGNOSIS — F909 Attention-deficit hyperactivity disorder, unspecified type: Secondary | ICD-10-CM

## 2023-02-14 NOTE — Progress Notes (Signed)
[facility] Counselor/therapist psychotherapy note  Name: Craig Fletcher Date: 02/14/23 [id number] DOB: [birth year] PCP: Creola Corn, MD  Time spent:  50 minutes   Treatment: individual therapy  Virtual Visit via Telehealth Note Connected with patient by a telemedicine/telehealth application, with their informed consent, and verified patient privacy and that I am speaking with the correct person using two identifiers. I discussed the limitations, risks, security and privacy concerns of performing psychotherapy and the availability of in person appointments. I also discussed with the patient that there may be a patient responsible charge related to this service. The patient expressed understanding and agreed to proceed. I discussed the treatment planning with the patient. The patient was provided an opportunity to ask questions and all were answered. The patient agreed with the plan and demonstrated an understanding of the instructions. The patient was advised to call  our office if  symptoms worsen or feel they are in a crisis state and need immediate contact.   Therapist Location: office Patient Location: home    Mental Status Exam:    Appearance:   casual  Behavior:  WNL  /Motor:  WNL  Speech/Language:   Clear and Coherent  Affect:  Full range  Mood:  Euthymic  Thought process:  normal  Thought content:    WNL  Sensory/Perceptual disturbances:    WNL  Orientation:  x4  Attention:  Good  Concentration:  Good  Memory:  WNL  Fund of knowledge:   Good  Insight:    Good  Judgment:   Good  Impulse Control:  Good   Reported Symptoms:  Anxious, irritability, distractible, sadness, distractible, some problems w/ focus  Risk Assessment: Danger to Self:  No Self-injurious Behavior: No Danger to Others: No Duty to Warn:no Physical Aggression / Violence:No  Access to Firearms a concern: No  Gang Involvement:No  Patient / guardian was educated about steps to take if suicide or  homicide risk level increases between visits: yes While future psychiatric events cannot be accurately predicted, the patient does not currently require acute inpatient psychiatric care and does not currently meet [facility] involuntary commitment criteria.    Subjective:   Patient engaged in telehealth video visit.  He shared events since last visit which was about 1 year ago. 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 He stated he was disappointed as his wife was initially critical of not getting enough notice about when his ceremony will be. He identified the need to have more support from her, feels invalidated often; he feels this may contribute to his use of alcohol a few times / week, which he want to discontinue. Reasons to make this change was identified.    Interventions: Communication strategies, supportive approaches, motivational interviewing  Diagnoses:  Adjustment disorder: AD/HD   Individualized Plan of Care:   1. Patient to engage in family psychotherapy 2-4 times a month or as needed.  2. Patient is to use communication, coping skills to improve family relationships.    3.  Patient will identify when he needs time to remove himself from a situation in which he may make comments that are hurtful.    4. Patient/family to contact this office, go to the local ED or call 911 if a crisis or emergency develops between visits.  Progress: Progressing   [name], [facility]

## 2023-02-20 ENCOUNTER — Other Ambulatory Visit: Payer: Self-pay | Admitting: Neurology

## 2023-03-09 ENCOUNTER — Ambulatory Visit: Payer: 59 | Admitting: Mental Health

## 2023-03-09 DIAGNOSIS — F909 Attention-deficit hyperactivity disorder, unspecified type: Secondary | ICD-10-CM

## 2023-03-09 NOTE — Progress Notes (Signed)
[facility] Counselor/therapist psychotherapy note  Name: Craig Fletcher Date: 02/14/23 [id number]:/[id number] DOB: 1985-09-12 PCP: Creola Corn, MD  Time spent:  35 minutes   Treatment: individual therapy  Virtual Visit via Telehealth Note Connected with patient by a telemedicine/telehealth application, with their informed consent, and verified patient privacy and that I am speaking with the correct person using two identifiers. I discussed the limitations, risks, security and privacy concerns of performing psychotherapy and the availability of in person appointments. I also discussed with the patient that there may be a patient responsible charge related to this service. The patient expressed understanding and agreed to proceed. I discussed the treatment planning with the patient. The patient was provided an opportunity to ask questions and all were answered. The patient agreed with the plan and demonstrated an understanding of the instructions. The patient was advised to call  our office if  symptoms worsen or feel they are in a crisis state and need immediate contact.   Therapist Location: office Patient Location: home    Mental Status Exam:    Appearance:   casual  Behavior:  WNL  /Motor:  WNL  Speech/Language:   Clear and Coherent  Affect:  Full range  Mood:  Euthymic  Thought process:  normal  Thought content:    WNL  Sensory/Perceptual disturbances:    WNL  Orientation:  x4  Attention:  Good  Concentration:  Good  Memory:  WNL  Fund of knowledge:   Good  Insight:    Good  Judgment:   Good  Impulse Control:  Good   Reported Symptoms:  Anxious, irritability, distractible, sadness, distractible, some problems w/ focus  Risk Assessment: Danger to Self:  No Self-injurious Behavior: No Danger to Others: No Duty to Warn:no Physical Aggression / Violence:No  Access to Firearms a concern: No  Gang Involvement:No  Patient / guardian was educated about steps to take if suicide or  homicide risk level increases between visits: yes While future psychiatric events cannot be accurately predicted, the patient does not currently require acute inpatient psychiatric care and does not currently meet [facility] involuntary commitment criteria.    Subjective:   Patient engaged in telehealth video visit.  Assess progress.  Patient shared how the communication between he and his wife has improved.  He went on to share details and how he feels less stressed recently as a result.  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  Assessed other stressors where he stated his son has had less behavioral issues and how they have enjoyed [employer]  He plans to discontinue his participation in June as he the [employer].  He went on to share how he wants to ensure that the kids involved have later and patient was encouraged to recognize that he is given them up to 1 year notice about his exit.  Explored collaboratively ways to communicate his feelings effectively with his wife as to offload any stress that increases between sessions.   Interventions: Communication strategies, supportive approaches, motivational interviewing  Diagnoses:   AD/HD   Individualized Plan of Care:   1. Patient to engage in family psychotherapy 2-4 times a month or as needed.  2. Patient is to use communication, coping skills to improve family relationships.    3.  Patient will identify when he needs time to remove himself from a situation in which he may make comments that are hurtful.    4. Patient/family  to contact this office, go to the local ED or call 911 if a crisis or emergency develops between visits.  Progress: Progressing   [name], [facility]

## 2023-03-22 ENCOUNTER — Other Ambulatory Visit: Payer: Self-pay | Admitting: Neurology

## 2023-04-29 ENCOUNTER — Other Ambulatory Visit: Payer: Self-pay | Admitting: Neurology

## 2023-06-15 ENCOUNTER — Other Ambulatory Visit: Payer: Self-pay | Admitting: Neurology

## 2023-06-21 ENCOUNTER — Telehealth: Payer: Self-pay | Admitting: Neurology

## 2023-06-21 MED ORDER — LAMOTRIGINE 200 MG PO TABS: 200.0000 mg | ORAL_TABLET | Freq: Every day | ORAL | 0 refills | Status: DC

## 2023-06-21 NOTE — Telephone Encounter (Signed)
 Pt has scheduled his 1 r f/u, on wait list, pt has confirmed no new issues in this less than 2 yr time since he has been seen.  Pt is asking for a refill on his lamoTRIgine  (LAMICTAL ) 100 MG tablet to [id number] until appointment date

## 2023-07-19 ENCOUNTER — Encounter: Payer: Self-pay | Admitting: Neurology

## 2023-07-19 ENCOUNTER — Ambulatory Visit: Admitting: Neurology

## 2023-07-19 VITALS — BP 119/71 | HR 91 | Ht 72.0 in | Wt 218.5 lb

## 2023-07-19 DIAGNOSIS — R0681 Apnea, not elsewhere classified: Secondary | ICD-10-CM

## 2023-07-19 DIAGNOSIS — G4719 Other hypersomnia: Secondary | ICD-10-CM

## 2023-07-19 DIAGNOSIS — R0683 Snoring: Secondary | ICD-10-CM

## 2023-07-19 DIAGNOSIS — Z9189 Other specified personal risk factors, not elsewhere classified: Secondary | ICD-10-CM | POA: Diagnosis not present

## 2023-07-19 DIAGNOSIS — R519 Headache, unspecified: Secondary | ICD-10-CM

## 2023-07-19 DIAGNOSIS — E663 Overweight: Secondary | ICD-10-CM

## 2023-07-19 NOTE — Patient Instructions (Signed)

## 2023-07-19 NOTE — Progress Notes (Signed)
 Subjective:    Patient ID: Craig Fletcher is a 38 y.o. male.  HPI    Debbra Fairy, MD, PhD [facility] [address], [address] [address] [place], [place] [address]  Dear Dr. Mamie Searles,  I saw your patient, Craig Fletcher, upon your kind request in my sleep clinic today for initial consultation of his sleep disorder, in particular, concern for underlying obstructive sleep apnea.  The patient is unaccompanied today.  As you know, Craig Fletcher is a 38 year old male with an underlying medical history of ADD, anxiety, episodic/transient neurological symptoms (for which Dr. Samara Crest in this office, on Lamictal ), and overweight state, who reports snoring and excessive daytime somnolence, as well as breathing pauses while asleep per wife's observation.  His snoring has become worse over the years.  His Epworth sleepiness score is 14 out of 24, fatigue severity score is 27 out of 63.  I reviewed your office note from 06/13/2023. He has no nightly nocturia.  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  He lives with his wife and 7 year old.  They have 1 dog and 2 cats in the household pets in the bedroom at night.  His wife likes to sleep with the TV on.  He is a non-smoker and limits his caffeine to 1 or 2 cups of coffee per day, typically in the morning, he drinks alcohol occasionally.  He is not aware of any family history of sleep apnea.  He has worked on weight loss and lost about 20 pounds within the past 2 years.  Bedtime is generally between 9:30 PM and 10 PM when he is at home, rise time around 6.  He has had occasional mild morning headaches and sometimes wakes up foggy headed.  His Past Medical History Is Significant For: Past Medical History:  Diagnosis Date   ADHD    Anxiety     His Past Surgical History Is Significant For: Past Surgical History:  Procedure Laterality Date   CHEST WALL RECONSTRUCTION      His Family History Is  Significant For: Family History  Problem Relation Age of Onset   Depression Mother    Alcohol abuse Father    Sleep apnea Neg Hx     His Social History Is Significant For: Social History   Socioeconomic History   Marital status: Married    Spouse name: Not on file   Number of children: Not on file   Years of education: Not on file   Highest education level: Not on file  Occupational History   Not on file  Tobacco Use   Smoking status: Never   Smokeless tobacco: Never  Vaping Use   Vaping status: Not on file  Substance and Sexual Activity   Alcohol use: Not Currently    Comment: rare/occ   Drug use: No   Sexual activity: Not on file  Other Topics Concern   Not on file  Social History Narrative   Pt lives alone    Pt works    Social Drivers of Corporate investment banker Strain: Not on file  Food Insecurity: Not on file  Transportation Needs: Not on file  Physical Activity: Not on file  Stress: Not on file  Social Connections: Not on file    His Allergies Are:  No Known Allergies:   His Current Medications Are:  Outpatient Encounter Medications as of 07/19/2023  Medication Sig   ibuprofen  (ADVIL ) 200 MG  tablet Take 200 mg by mouth as needed for moderate pain (pain score 4-6).   lamoTRIgine  (LAMICTAL ) 200 MG tablet Take 1 tablet (200 mg total) by mouth daily. Appointment needed for further refills   melatonin 5 MG TABS Take 5 mg by mouth as needed.   Misc Natural Products (ELDERBERRY IMMUNE COMPLEX PO) Take by mouth.   vitamin B-12 (CYANOCOBALAMIN) 1000 MCG tablet Take 1,000 mcg by mouth daily.   Vitamin D , Ergocalciferol , (DRISDOL) 1.25 MG (50000 UNIT) CAPS capsule Take 50,000 Units by mouth once a week.   ibuprofen  (ADVIL ) 800 MG tablet Take 1 tablet (800 mg total) by mouth every 8 (eight) hours as needed.   No facility-administered encounter medications on file as of 07/19/2023.  :   Review of Systems:  Out of a complete 14 point review of systems, all are  reviewed and negative with the exception of these symptoms as listed below:   Review of Systems  Neurological:        Pt here for sleep consult  Pt snores,headaches,fatigue Pt denies hypertension,sleep study,cpap machine    ESS  FSS     Objective:  Neurological Exam  Physical Exam Physical Examination:   Vitals:   07/19/23 0839  BP: 119/71  Pulse: 91    General Examination: The patient is a very pleasant 38 y.o. male in no acute distress. He appears well-developed and well-nourished and well groomed.   HEENT: Normocephalic, atraumatic, pupils are equal, round and reactive to light, extraocular tracking is good without limitation to gaze excursion or nystagmus noted. Hearing is grossly intact. Face is symmetric with normal facial animation. Speech is clear with no dysarthria noted. There is no hypophonia. There is no lip, neck/head, jaw or voice tremor. Neck is supple with full range of passive and active motion. There are no carotid bruits on auscultation. Oropharynx exam reveals: mild mouth dryness, good dental hygiene and moderate airway crowding, due to small airway entry, uvula larger.  Mallampati class II, tonsils 1+ bilaterally, neck circumference 16 inches, mild to moderate overbite noted.  Tongue protrudes centrally and palate elevates symmetrically.  Chest: Clear to auscultation without wheezing, rhonchi or crackles noted.  Heart: S1+S2+0, regular and normal without murmurs, rubs or gallops noted.   Abdomen: Soft, non-tender and non-distended.  Extremities: There is no pitting edema in the distal lower extremities bilaterally.   Skin: Warm and dry without trophic changes noted.   Musculoskeletal: exam reveals no obvious joint deformities.   Neurologically:  Mental status: The patient is awake, alert and oriented in all 4 spheres. His immediate and remote memory, attention, language skills and fund of knowledge are appropriate. There is no evidence of aphasia, agnosia,  apraxia or anomia. Speech is clear with normal prosody and enunciation. Thought process is linear. Mood is normal and affect is normal.  Cranial nerves II - XII are as described above under HEENT exam.  Motor exam: Normal bulk, strength and tone is noted. There is no obvious action or resting tremor.  Fine motor skills and coordination: grossly intact.  Cerebellar testing: No dysmetria or intention tremor. There is no truncal or gait ataxia.  Sensory exam: intact to light touch in the upper and lower extremities.  Gait, station and balance: He stands easily. No veering to one side is noted. No leaning to one side is noted. Posture is age-appropriate and stance is narrow based. Gait shows normal stride length and normal pace. No problems turning are noted.   Assessment and Plan:  In summary, Craig Fletcher is a very pleasant 38 y.o.-year old male with an underlying medical history of ADD, anxiety, episodic/transient neurological symptoms (for which Dr. Samara Crest in this office, on Lamictal ), and overweight state, whose history and physical exam are concerning for sleep disordered breathing, particularly obstructive sleep apnea (OSA). A laboratory attended sleep study is typically considered "gold standard" for evaluation of sleep disordered breathing.   I had a long chat with the patient about my findings and the diagnosis of sleep apnea, particularly OSA, its prognosis and treatment options. We talked about medical/conservative treatments, surgical interventions and non-pharmacological approaches for symptom control. I explained, in particular, the risks and ramifications of untreated moderate to severe OSA, especially with respect to developing cardiovascular disease down the road, including congestive heart failure (CHF), difficult to treat hypertension, cardiac arrhythmias (particularly A-fib), neurovascular complications including TIA, stroke and dementia. Even type 2 diabetes has, in part, been linked to  untreated OSA. Symptoms of untreated OSA may include (but may not be limited to) daytime sleepiness, nocturia (i.e. frequent nighttime urination), memory problems, mood irritability and suboptimally controlled or worsening mood disorder such as depression and/or anxiety, lack of energy, lack of motivation, physical discomfort, as well as recurrent headaches, especially morning or nocturnal headaches. We talked about the importance of maintaining a healthy lifestyle and striving for healthy weight. In addition, we talked about the importance of striving for and maintaining good sleep hygiene. I recommended a sleep study at this time. I outlined the differences between a laboratory attended sleep study which is considered more comprehensive and accurate over the option of a home sleep test (HST); the latter may lead to underestimation of sleep disordered breathing in some instances and does not help with diagnosing upper airway resistance syndrome and is not accurate enough to diagnose primary central sleep apnea typically. I outlined possible surgical and non-surgical treatment options of OSA, including the use of a positive airway pressure (PAP) device (i.e. CPAP, AutoPAP/APAP or BiPAP in certain circumstances), a custom-made dental device (aka oral appliance, which would require a referral to a specialist dentist or orthodontist typically, and is generally speaking not considered for patients with full dentures or edentulous state), upper airway surgical options, such as traditional UPPP (which is not considered a first-line treatment) or the Inspire device (hypoglossal nerve stimulator, which would involve a referral for consultation with an ENT surgeon, after careful selection, following inclusion criteria - also not first-line treatment). I explained the PAP treatment option to the patient in detail, as this is generally considered first-line treatment.  The patient indicated that he would be willing to try  PAP therapy, if the need arises. I explained the importance of being compliant with PAP treatment, not only for insurance purposes but primarily to improve patient's symptoms symptoms, and for the patient's long term health benefit, including to reduce His cardiovascular risks longer-term.    We will pick up our discussion about the next steps and treatment options after testing.  We will keep him posted as to the test results by phone call and/or MyChart messaging where possible.  We will plan to follow-up in sleep clinic accordingly as well.  I answered all his questions today and the patient was in agreement.   I encouraged him to call with any interim questions, concerns, problems or updates or email us  through MyChart.  Generally speaking, sleep test authorizations may take up to 2 weeks, sometimes less, sometimes longer, the patient is encouraged to get in touch  with us  if they do not hear back from the sleep lab staff directly within the next 2 weeks.  Thank you very much for allowing me to participate in the care of this nice patient. If I can be of any further assistance to you please do not hesitate to call me at [phone].  Sincerely,   Debbra Fairy, MD, PhD

## 2023-07-23 ENCOUNTER — Other Ambulatory Visit: Payer: Self-pay | Admitting: Neurology

## 2023-07-25 ENCOUNTER — Telehealth: Payer: Self-pay | Admitting: Neurology

## 2023-07-25 NOTE — Telephone Encounter (Signed)
 NPSG [employer] pending

## 2023-07-27 NOTE — Telephone Encounter (Signed)
 HST UHC no auth req UHC denied NPSG

## 2023-08-12 ENCOUNTER — Ambulatory Visit (INDEPENDENT_AMBULATORY_CARE_PROVIDER_SITE_OTHER): Admitting: Neurology

## 2023-08-12 DIAGNOSIS — Z9189 Other specified personal risk factors, not elsewhere classified: Secondary | ICD-10-CM

## 2023-08-12 DIAGNOSIS — R0681 Apnea, not elsewhere classified: Secondary | ICD-10-CM

## 2023-08-12 DIAGNOSIS — E663 Overweight: Secondary | ICD-10-CM

## 2023-08-12 DIAGNOSIS — G4719 Other hypersomnia: Secondary | ICD-10-CM

## 2023-08-12 DIAGNOSIS — G4733 Obstructive sleep apnea (adult) (pediatric): Secondary | ICD-10-CM

## 2023-08-12 DIAGNOSIS — R519 Headache, unspecified: Secondary | ICD-10-CM

## 2023-08-12 DIAGNOSIS — R0683 Snoring: Secondary | ICD-10-CM

## 2023-08-18 ENCOUNTER — Telehealth: Admitting: Neurology

## 2023-08-18 DIAGNOSIS — R4781 Slurred speech: Secondary | ICD-10-CM | POA: Diagnosis not present

## 2023-08-18 DIAGNOSIS — R4182 Altered mental status, unspecified: Secondary | ICD-10-CM | POA: Diagnosis not present

## 2023-08-18 DIAGNOSIS — R4701 Aphasia: Secondary | ICD-10-CM

## 2023-08-18 MED ORDER — LAMOTRIGINE 200 MG PO TABS: 200.0000 mg | ORAL_TABLET | Freq: Every day | ORAL | 3 refills | Status: AC

## 2023-08-18 NOTE — Patient Instructions (Signed)
 Great to meet you today! Please come in the next few weeks to have your Lamictal  level drawn.  Continue Lamictal  at current dosing.  Please call for any recurrent spells.  Follow-up in 1 year.  Thanks!!

## 2023-08-18 NOTE — Progress Notes (Signed)
 Patient: Craig Fletcher Date of Birth: Jul 19, 1985  Reason for Visit: Follow up History from: Patient Primary Neurologist: Gregg  Virtual Visit via Video Note  I connected with Craig Fletcher on 08/18/23 at  1:15 PM EDT by a video enabled telemedicine application and verified that I am speaking with the correct person using two identifiers.  Location: Patient: at his home Provider: in the office    I discussed the limitations of evaluation and management by telemedicine and the availability of in person appointments. The patient expressed understanding and agreed to proceed.  ASSESSMENT AND PLAN 38 y.o. year old male with history of anxiety and ADHD presenting with multiple episodes of slurred speech lasting hours most of the time associated with alcohol intake, have been episodes that are unassociated with alcohol intake.  During these episodes he is irritable and argumentative which is not typical of his personality.  Routine EEG was negative.  Started on Lamictal  titration June 2023.  2-hour EEG was normal in July 2023.  Autoimmune neurology antibody panel was normal September 2023.  CT head was normal.  Has been maintained on Lamictal  200 mg daily. Pending HST with Dr. Buck.   - No recurrent spells of slurred speech or personality change since at maintenance dose of Lamictal  - Continue Lamictal  200 mg daily - Come in the next few weeks for Lamictal  blood level, PCP follows routine labs - Pending results of HST - Call for any recurrent unusual spells will follow-up in 1 year with Dr. Gregg  Orders Placed This Encounter  Procedures   Lamotrigine  level   Meds ordered this encounter  Medications   lamoTRIgine  (LAMICTAL ) 200 MG tablet    Sig: Take 1 tablet (200 mg total) by mouth daily.    Dispense:  90 tablet    Refill:  3   HISTORY OF PRESENT ILLNESS: Today 08/18/23 Last saw Dr. Gregg June 2023. He did HST at home, waiting for results. Needs a refill of Lamictal . Is  currently taking 200 mg daily. He was originally taking at nighttime but gave him insomnia. Has not had any episodes of slurred speech and personality changes/behavior. In the past tried to get ambulatory EEG but was expensive with insurance. Reports his wife is an Charity fundraiser hasn't mentioned any concerns. He works as a IT sales professional. Working on weight loss. Had physical this year, reports labs were fine.   HISTORY  INTERVAL HISTORY 08/11/2021:  Patient presents today for follow-up, wife was available via phone.  She still report ongoing symptoms of slurred speech, altered mental status, this happens weekly.  Sometimes it is related to patient not drinking enough fluid or related to alcohol use and sometimes there are no triggers.   Patient reported sometimes he is not aware of the symptoms and tell wife pointed out to him.   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  Per wife, the first episode happened about 2-1/2 years ago while he was at the beach and possibly had some alcohol.  They related it to alcohol intake.  Since then patient has about 6 episodes of slurred speech, during this time, he is aware, wife noted that he is a little argumentative and irritable and his speech is slurred, he is able to comprehend, and follow direction.  The last episode was last Friday and lasted about 5 hours.  It took about 4 people to convince  him to go to the ED and he initially refused to go.  He has been seen previously in the emergency department, had Brain MRI which was negative for any acute stroke. In his last presentation he also had a head CT which was negative for acute stroke.  Most of the episodes are associated with alcohol intake but patient reported taking only one sip of alcohol but wife noted there was maybe 1 or 2 episodes that were not associated with slurred with alcohol intake that they  attributed to dehydration.   He is a IT sales professional, denies any previous history of TBI, no previous history of seizures, no family history of seizures but his family history of alcohol abuse.  Wife also reports trouble with memory, trouble with focusing, he has been cutting down on alcohol and energy drinks.  He carries a diagnosis of ADHD and he is on Adderall.  REVIEW OF SYSTEMS: Out of a complete 14 system review of symptoms, the patient complains only of the following symptoms, and all other reviewed systems are negative.  See HPI  ALLERGIES: No Known Allergies  HOME MEDICATIONS: Outpatient Medications Prior to Visit  Medication Sig Dispense Refill   ibuprofen  (ADVIL ) 200 MG tablet Take 200 mg by mouth as needed for moderate pain (pain score 4-6).     ibuprofen  (ADVIL ) 800 MG tablet Take 1 tablet (800 mg total) by mouth every 8 (eight) hours as needed. 30 tablet 0   lamoTRIgine  (LAMICTAL ) 200 MG tablet TAKE 1 TABLET (200 MG TOTAL) BY MOUTH DAILY. APPOINTMENT NEEDED FOR FURTHER REFILLS 30 tablet 0   melatonin 5 MG TABS Take 5 mg by mouth as needed.     Misc Natural Products (ELDERBERRY IMMUNE COMPLEX PO) Take by mouth.     vitamin B-12 (CYANOCOBALAMIN) 1000 MCG tablet Take 1,000 mcg by mouth daily.     Vitamin D , Ergocalciferol , (DRISDOL) 1.25 MG (50000 UNIT) CAPS capsule Take 50,000 Units by mouth once a week.     No facility-administered medications prior to visit.    PAST MEDICAL HISTORY: Past Medical History:  Diagnosis Date   ADHD    Anxiety     PAST SURGICAL HISTORY: Past Surgical History:  Procedure Laterality Date   CHEST WALL RECONSTRUCTION      FAMILY HISTORY: Family History  Problem Relation Age of Onset   Depression Mother    Alcohol abuse Father    Sleep apnea Neg Hx     SOCIAL HISTORY: Social History   Socioeconomic History   Marital status: Married    Spouse name: Not on file   Number of children: Not on file   Years of education: Not on file    Highest education level: Not on file  Occupational History   Not on file  Tobacco Use   Smoking status: Never   Smokeless tobacco: Never  Vaping Use   Vaping status: Not on file  Substance and Sexual Activity   Alcohol use: Not Currently    Comment: rare/occ   Drug use: No   Sexual activity: Not on file  Other Topics Concern   Not on file  Social History Narrative   Pt lives alone    Pt works    Social Drivers of Corporate investment banker Strain: Not on file  Food Insecurity: Not on file  Transportation Needs: Not on file  Physical Activity: Not on file  Stress: Not on file  Social Connections: Not on file  Intimate Partner Violence: Not on file  PHYSICAL EXAM  There were no vitals filed for this visit. There is no height or weight on file to calculate BMI.  Generalized: Well developed, in no acute distress  Via video visit, patient is alert and oriented, speech is clear and concise, facial symmetry noted, moves about freely  DIAGNOSTIC DATA (LABS, IMAGING, TESTING) - I reviewed patient records, labs, notes, testing and imaging myself where available.  Lab Results  Component Value Date   WBC 4.6 02/13/2021   HGB 13.6 02/13/2021   HCT 40.2 02/13/2021   MCV 86.8 02/13/2021   PLT 308 02/13/2021      Component Value Date/Time   NA 137 02/13/2021 2053   K 3.9 02/13/2021 2053   CL 102 02/13/2021 2053   CO2 25 02/13/2021 2053   GLUCOSE 94 02/13/2021 2053   BUN 14 02/13/2021 2053   CREATININE 0.90 02/13/2021 2053   CALCIUM 8.7 (L) 02/13/2021 2053   PROT 7.4 02/13/2021 2053   ALBUMIN 4.4 02/13/2021 2053   AST 30 02/13/2021 2053   ALT 33 02/13/2021 2053   ALKPHOS 42 02/13/2021 2053   BILITOT 0.5 02/13/2021 2053   GFRNONAA >60 02/13/2021 2053   GFRAA >60 06/18/2019 1850   No results found for: CHOL, HDL, LDLCALC, LDLDIRECT, TRIG, CHOLHDL No results found for: YHAJ8R No results found for: CPUJFPWA87 Lab Results  Component Value Date   TSH  2.38 11/14/2017   Lauraine Born, AGNP-C, DNP 08/18/2023, 1:27 PM [facility] [address] [place], [place] [address] [phone]

## 2023-08-24 NOTE — Progress Notes (Signed)
 See procedure note.

## 2023-08-26 ENCOUNTER — Ambulatory Visit: Payer: Self-pay | Admitting: Neurology

## 2023-08-26 DIAGNOSIS — G4733 Obstructive sleep apnea (adult) (pediatric): Secondary | ICD-10-CM

## 2023-08-26 NOTE — Procedures (Signed)
 [facility]  HOME SLEEP TEST (SANSA) REPORT (Mail-Out Device):   STUDY DATE: 08/15/2023  DOB: 09-Jan-1986  MRN: [id number]  ORDERING CLINICIAN: True Mar, MD, PhD   REFERRING CLINICIAN: Onita Rush, MD   CLINICAL INFORMATION/HISTORY: 38 year old male with an underlying medical history of ADD, anxiety, episodic/transient neurological symptoms (for which Dr. Gregg in this office, on Lamictal ), and overweight state, who reports snoring and excessive daytime somnolence, as well as breathing pauses while asleep.  PATIENT'S LAST REPORTED EPWORTH SLEEPINESS SCORE (ESS): 14/24.  BMI (at the time of sleep clinic visit and/or test date): 29.6 kg/m  FINDINGS:   Study Protocol:    The SANSA single-point-of-skin-contact chest-worn sensor - an FDA cleared and DOT approved type 4 home sleep test device - measures eight physiological channels,  including blood oxygen saturation (measured via PPG [photoplethysmography]), EKG-derived heart rate, respiratory effort, chest movement (measured via accelerometer), snoring, body position, and actigraphy. The device is designed to be worn for up to 10 hours per study.   Sleep Summary:   Total Recording Time (hours, min): 9 hours, 57 min  Total Effective Sleep Time (hours, min):  8 hours, 6 min  Sleep Efficiency (%):    81%   Respiratory Indices:   Calculated sAHI (per hour):  20.7/hour         Oxygen Saturation Statistics:    Oxygen Saturation (%) Mean: 93.8%   Minimum oxygen saturation (%):                 59%   O2 Saturation Range (%): 59-98.6%   Time below or at 88% saturation: 15 min   Pulse Rate Statistics:   Pulse Mean (bpm):    72/min    Pulse Range (52- 113/min)   Snoring: Mild to moderate  IMPRESSION/DIAGNOSES:   OSA (obstructive sleep apnea), moderate   RECOMMENDATIONS:   This home sleep test demonstrates moderate obstructive sleep apnea with a total AHI of 20.7/hour and O2 nadir of 59%.  Mild  to moderate snoring was detected. Treatment with a positive airway pressure (PAP) device is recommended. The patient will be advised to proceed with an autoPAP titration/trial at home for now. A full night titration study may be considered to optimize treatment settings, monitor proper oxygen saturations and aid with improvement of tolerance and adherence, if needed down the road. Alternative treatment options may include a dental device through dentistry or orthodontics in selected patients or Inspire (hypoglossal nerve stimulator) in carefully selected patients (meeting inclusion criteria).  Concomitant weight loss is recommended (where clinically appropriate). Please note that untreated obstructive sleep apnea may carry additional perioperative morbidity. Patients with significant obstructive sleep apnea should receive perioperative PAP therapy and the surgeons and particularly the anesthesiologist should be informed of the diagnosis and the severity of the sleep disordered breathing. The patient should be cautioned not to drive, work at heights, or operate dangerous or heavy equipment when tired or sleepy. Review and reiteration of good sleep hygiene measures should be pursued with any patient. Other causes of the patient's symptoms, including circadian rhythm disturbances, an underlying mood disorder, medication effect and/or an underlying medical problem cannot be ruled out based on this test. Clinical correlation is recommended.  The patient and his referring provider will be notified of the test results. The patient will be seen in follow up in sleep clinic at [facility].  I certify that I have reviewed the raw data recording prior to the issuance of this report in accordance with the standards  of the [employer] ([employer]ASM).    INTERPRETING PHYSICIAN:   True Mar, MD, PhD Medical Director, [facility]) Diplomat, ABPN (Neurology and Sleep)    [facility] [address] [place], [place] [address] [phone]

## 2023-08-30 NOTE — Telephone Encounter (Signed)
 I called pt and relayed results of sleep study moderate OSA.  Recommendation of autopap.  Send order to DME [employer]. Will see after preauthorization to go over use machine, supplies, fit for mask.  Will see 2-3 months after use for insurance compliance appt.  Appt made 11-16-2023 at 1145 with MM/NP.  Use 4 hr or more for compliance.  Order message sent to [employer].  Pt verbalized understanding. Will call back if questions.

## 2023-08-30 NOTE — Telephone Encounter (Signed)
-----   Message from True Mar sent at 08/26/2023  1:21 PM EDT ----- Patient referred by PCP, seen by me on 07/19/2023, patient had a HST on 08/15/2023.    Please call and notify the patient that the recent home sleep test showed obstructive sleep apnea in the moderate range. I recommend treatment in the form of autoPAP, which means, that we don't have  to bring him in for a sleep study with CPAP, but will let him start using a so called autoPAP machine at home, which is a CPAP-like machine with self-adjusting pressures. We will send the order to a  local DME company (of his choice, or as per insurance requirement). The DME representative will fit him with a mask, educate him on how to use the machine, how to put the mask on, etc. I have placed  an order in the chart. Please send the order, talk to patient, send report to referring MD. We will need a FU in sleep clinic for 10 weeks post-PAP set up, please arrange that with me or one of our  NPs. Also reinforce the need for compliance with treatment. Thanks,   True Mar, MD, PhD [facility])    ----- Message ----- From: Mar True, MD Sent: 08/26/2023   1:20 PM EDT To: True Mar, MD

## 2023-09-01 NOTE — Telephone Encounter (Signed)
 RE: new autopap user Received: Nilsa Joylene Adine Neysa Nena GORMAN, RN; Joylene Carlean Sheree Leveda Viktoria Dortha Jackson Avelina; 1 other Received, thank you!       Previous Messages    ----- Message ----- From: Neysa Nena GORMAN, RN Sent: 08/30/2023  10:30 AM EDT To: Adine Joylene; Avelina Jackson; Ephraim Viktoria* Subject: new autopap user                              Good morning,  New pap user and order in EPIC  Fairy PHEBE Epps Male, 38 y.o., 1985/07/17 Pronouns: decline to answer MRN: [id number] Phone: [phone]   Thanks, Particia RN

## 2023-09-16 ENCOUNTER — Other Ambulatory Visit (HOSPITAL_BASED_OUTPATIENT_CLINIC_OR_DEPARTMENT_OTHER): Payer: Self-pay | Admitting: Family Medicine

## 2023-09-16 DIAGNOSIS — Z8249 Family history of ischemic heart disease and other diseases of the circulatory system: Secondary | ICD-10-CM

## 2023-09-26 ENCOUNTER — Ambulatory Visit (HOSPITAL_BASED_OUTPATIENT_CLINIC_OR_DEPARTMENT_OTHER)
Admission: RE | Admit: 2023-09-26 | Discharge: 2023-09-26 | Disposition: A | Discharge status: Home / Self Care | Payer: Self-pay | Source: Ambulatory Visit | Attending: Family Medicine | Admitting: Family Medicine

## 2023-09-26 DIAGNOSIS — Z8249 Family history of ischemic heart disease and other diseases of the circulatory system: Secondary | ICD-10-CM

## 2023-11-15 ENCOUNTER — Encounter: Payer: Self-pay | Admitting: *Deleted

## 2023-11-16 ENCOUNTER — Telehealth: Admitting: Adult Health

## 2023-11-16 DIAGNOSIS — G4733 Obstructive sleep apnea (adult) (pediatric): Secondary | ICD-10-CM | POA: Diagnosis not present

## 2023-11-16 NOTE — Patient Instructions (Signed)
 Continue using CPAP nightly and greater than 4 hours each night If your symptoms worsen or you develop new symptoms please let us  know.

## 2023-11-16 NOTE — Progress Notes (Signed)
 Virtual Visit via Video Note  I connected with Craig Fletcher on 11/16/23 at 11:45 AM EDT by a video enabled telemedicine application located remotely at [facility] and verified that I am speaking with the correct person using two identifiers who was located at their own home.  Verified that they were in [place]    I discussed the limitations of evaluation and management by telemedicine and the availability of in person appointments. The patient expressed understanding and agreed to proceed.     HISTORY OF PRESENT ILLNESS: Today 11/16/23:   Craig Fletcher is a 38 y.o. male with a history of obstructive sleep apnea on CPAP. Returns today for follow-up.  This is his initial compliance visit.  He states that CPAP is working well for him.  He had does notice the benefit.  Wakes up more refreshed.  Currently has a Dream[employer]ear fullface mask.  His download is below     REVIEW OF SYSTEMS: Out of a complete 14 system review of symptoms, the patient complains only of the following symptoms, and all other reviewed systems are negative.  ALLERGIES: No Known Allergies  HOME MEDICATIONS: Outpatient Medications Prior to Visit  Medication Sig Dispense Refill   ibuprofen  (ADVIL ) 200 MG tablet Take 200 mg by mouth as needed for moderate pain (pain score 4-6).     ibuprofen  (ADVIL ) 800 MG tablet Take 1 tablet (800 mg total) by mouth every 8 (eight) hours as needed. 30 tablet 0   lamoTRIgine  (LAMICTAL ) 200 MG tablet Take 1 tablet (200 mg total) by mouth daily. 90 tablet 3   melatonin 5 MG TABS Take 5 mg by mouth as needed.     Misc Natural Products (ELDERBERRY IMMUNE COMPLEX PO) Take by mouth.     vitamin B-12 (CYANOCOBALAMIN) 1000 MCG tablet Take 1,000 mcg by mouth daily.     Vitamin D , Ergocalciferol , (DRISDOL) 1.25 MG (50000 UNIT) CAPS capsule Take 50,000 Units by mouth once a week.     No facility-administered medications prior to visit.    PAST MEDICAL  HISTORY: Past Medical History:  Diagnosis Date   ADHD    Anxiety     PAST SURGICAL HISTORY: Past Surgical History:  Procedure Laterality Date   CHEST WALL RECONSTRUCTION      FAMILY HISTORY: Family History  Problem Relation Age of Onset   Depression Mother    Alcohol abuse Father    Sleep apnea Neg Hx     SOCIAL HISTORY: Social History   Socioeconomic History   Marital status: Married    Spouse name: Not on file   Number of children: Not on file   Years of education: Not on file   Highest education level: Not on file  Occupational History   Not on file  Tobacco Use   Smoking status: Never   Smokeless tobacco: Never  Vaping Use   Vaping status: Not on file  Substance and Sexual Activity   Alcohol use: Not Currently    Comment: rare/occ   Drug use: No   Sexual activity: Not on file  Other Topics Concern   Not on file  Social History Narrative   Pt lives alone    Pt works    Social Drivers of Corporate investment banker Strain: Not on file  Food Insecurity: Not on file  Transportation Needs: Not on file  Physical Activity: Not on file  Stress: Not on file  Social Connections: Not on file  Intimate  Partner Violence: Not on file    PHYSICAL EXAM  Generalized: Well developed, in no acute distress  Chest: Lungs clear to auscultation bilaterally  Neurological examination  Mentation: Alert oriented to time, place, history taking. Follows all commands speech and language fluent Cranial nerve II-XII: Facial symmetry noted   DIAGNOSTIC DATA (LABS, IMAGING, TESTING) - I reviewed patient records, labs, notes, testing and imaging myself where available.  Lab Results  Component Value Date   WBC 4.6 02/13/2021   HGB 13.6 02/13/2021   HCT 40.2 02/13/2021   MCV 86.8 02/13/2021   PLT 308 02/13/2021      Component Value Date/Time   NA 137 02/13/2021 2053   K 3.9 02/13/2021 2053   CL 102 02/13/2021 2053   CO2 25 02/13/2021 2053   GLUCOSE 94 02/13/2021  2053   BUN 14 02/13/2021 2053   CREATININE 0.90 02/13/2021 2053   CALCIUM 8.7 (L) 02/13/2021 2053   PROT 7.4 02/13/2021 2053   ALBUMIN 4.4 02/13/2021 2053   AST 30 02/13/2021 2053   ALT 33 02/13/2021 2053   ALKPHOS 42 02/13/2021 2053   BILITOT 0.5 02/13/2021 2053   GFRNONAA >60 02/13/2021 2053   GFRAA >60 06/18/2019 1850   No results found for: CHOL, HDL, LDLCALC, LDLDIRECT, TRIG, CHOLHDL No results found for: YHAJ8R No results found for: VITAMINB12 Lab Results  Component Value Date   TSH 2.38 11/14/2017      ASSESSMENT AND PLAN 38 y.o. year old male  has a past medical history of ADHD and Anxiety. here with:  OSA on CPAP  - CPAP compliance excellent - Good treatment of AHI  - Encourage patient to use CPAP nightly and > 4 hours each night - F/U in 1 year or sooner if needed    Duwaine Russell, MSN, NP-C 11/16/2023, 4:30 PM [facility] [address] [place], [place] [address] [phone]

## 2024-08-27 ENCOUNTER — Ambulatory Visit: Admitting: Neurology

## 2024-11-13 ENCOUNTER — Telehealth: Admitting: Adult Health
